# Patient Record
Sex: Female | Born: 1935 | Race: Black or African American | Hispanic: No | Marital: Married | Smoking: Never smoker
Health system: Southern US, Community
[De-identification: ages and names within clinical notes are randomized; demographics above are authoritative.]

## PROBLEM LIST (undated history)

## (undated) DIAGNOSIS — Z9889 Other specified postprocedural states: Secondary | ICD-10-CM

## (undated) DIAGNOSIS — E7800 Pure hypercholesterolemia, unspecified: Secondary | ICD-10-CM

## (undated) DIAGNOSIS — H9190 Unspecified hearing loss, unspecified ear: Secondary | ICD-10-CM

## (undated) DIAGNOSIS — E079 Disorder of thyroid, unspecified: Secondary | ICD-10-CM

## (undated) DIAGNOSIS — I422 Other hypertrophic cardiomyopathy: Secondary | ICD-10-CM

## (undated) DIAGNOSIS — I272 Pulmonary hypertension, unspecified: Secondary | ICD-10-CM

## (undated) DIAGNOSIS — Z7901 Long term (current) use of anticoagulants: Secondary | ICD-10-CM

## (undated) DIAGNOSIS — I4891 Unspecified atrial fibrillation: Secondary | ICD-10-CM

## (undated) DIAGNOSIS — I872 Venous insufficiency (chronic) (peripheral): Secondary | ICD-10-CM

## (undated) DIAGNOSIS — J984 Other disorders of lung: Secondary | ICD-10-CM

## (undated) DIAGNOSIS — K219 Gastro-esophageal reflux disease without esophagitis: Secondary | ICD-10-CM

## (undated) DIAGNOSIS — Z950 Presence of cardiac pacemaker: Secondary | ICD-10-CM

## (undated) DIAGNOSIS — I2510 Atherosclerotic heart disease of native coronary artery without angina pectoris: Secondary | ICD-10-CM

## (undated) DIAGNOSIS — I495 Sick sinus syndrome: Secondary | ICD-10-CM

## (undated) DIAGNOSIS — L80 Vitiligo: Secondary | ICD-10-CM

## (undated) DIAGNOSIS — IMO0001 Reserved for inherently not codable concepts without codable children: Secondary | ICD-10-CM

## (undated) HISTORY — PX: REVISION TOTAL HIP ARTHROPLASTY: SHX766

## (undated) HISTORY — DX: Venous insufficiency (chronic) (peripheral): I87.2

## (undated) HISTORY — DX: Long term (current) use of anticoagulants: Z79.01

## (undated) HISTORY — DX: Vitiligo: L80

## (undated) HISTORY — DX: Other specified postprocedural states: Z98.890

## (undated) HISTORY — PX: ABDOMINAL HYSTERECTOMY: SHX81

## (undated) HISTORY — PX: EYE SURGERY: SHX253

## (undated) HISTORY — DX: Other hypertrophic cardiomyopathy: I42.2

## (undated) HISTORY — DX: Presence of cardiac pacemaker: Z95.0

---

## 1996-10-30 DIAGNOSIS — I2510 Atherosclerotic heart disease of native coronary artery without angina pectoris: Secondary | ICD-10-CM

## 1996-10-30 HISTORY — DX: Atherosclerotic heart disease of native coronary artery without angina pectoris: I25.10

## 1996-10-30 HISTORY — PX: CORONARY ARTERY BYPASS GRAFT: SHX141

## 1999-03-16 ENCOUNTER — Ambulatory Visit (HOSPITAL_COMMUNITY): Admission: RE | Admit: 1999-03-16 | Discharge: 1999-03-16 | Payer: Self-pay | Admitting: Gastroenterology

## 1999-05-18 ENCOUNTER — Ambulatory Visit (HOSPITAL_COMMUNITY): Admission: RE | Admit: 1999-05-18 | Discharge: 1999-05-18 | Payer: Self-pay | Admitting: Gastroenterology

## 1999-05-18 ENCOUNTER — Encounter: Payer: Self-pay | Admitting: Gastroenterology

## 1999-07-13 ENCOUNTER — Encounter: Payer: Self-pay | Admitting: Gastroenterology

## 1999-07-13 ENCOUNTER — Ambulatory Visit (HOSPITAL_COMMUNITY): Admission: RE | Admit: 1999-07-13 | Discharge: 1999-07-13 | Payer: Self-pay | Admitting: Gastroenterology

## 2002-07-14 ENCOUNTER — Encounter: Payer: Self-pay | Admitting: Family Medicine

## 2002-07-14 ENCOUNTER — Encounter: Admission: RE | Admit: 2002-07-14 | Discharge: 2002-07-14 | Payer: Self-pay | Admitting: Family Medicine

## 2003-03-24 ENCOUNTER — Ambulatory Visit (HOSPITAL_COMMUNITY): Admission: RE | Admit: 2003-03-24 | Discharge: 2003-03-24 | Payer: Self-pay | Admitting: Gastroenterology

## 2003-03-24 ENCOUNTER — Encounter: Payer: Self-pay | Admitting: Gastroenterology

## 2003-04-21 ENCOUNTER — Encounter (INDEPENDENT_AMBULATORY_CARE_PROVIDER_SITE_OTHER): Payer: Self-pay | Admitting: *Deleted

## 2003-04-21 ENCOUNTER — Inpatient Hospital Stay (HOSPITAL_COMMUNITY): Admission: RE | Admit: 2003-04-21 | Discharge: 2003-04-28 | Payer: Self-pay

## 2003-04-24 ENCOUNTER — Encounter: Payer: Self-pay | Admitting: Surgery

## 2003-08-26 ENCOUNTER — Inpatient Hospital Stay (HOSPITAL_COMMUNITY): Admission: RE | Admit: 2003-08-26 | Discharge: 2003-08-28 | Payer: Self-pay

## 2003-10-18 ENCOUNTER — Emergency Department (HOSPITAL_COMMUNITY): Admission: EM | Admit: 2003-10-18 | Discharge: 2003-10-18 | Payer: Self-pay | Admitting: Emergency Medicine

## 2004-08-10 ENCOUNTER — Encounter: Admission: RE | Admit: 2004-08-10 | Discharge: 2004-09-01 | Payer: Self-pay | Admitting: Family Medicine

## 2004-09-09 ENCOUNTER — Encounter: Admission: RE | Admit: 2004-09-09 | Discharge: 2004-09-09 | Payer: Self-pay | Admitting: Cardiovascular Disease

## 2004-10-04 ENCOUNTER — Encounter: Admission: RE | Admit: 2004-10-04 | Discharge: 2004-10-04 | Payer: Self-pay | Admitting: Family Medicine

## 2004-10-14 ENCOUNTER — Encounter: Admission: RE | Admit: 2004-10-14 | Discharge: 2004-10-14 | Payer: Self-pay | Admitting: Family Medicine

## 2004-10-28 ENCOUNTER — Encounter: Admission: RE | Admit: 2004-10-28 | Discharge: 2004-10-28 | Payer: Self-pay | Admitting: Family Medicine

## 2005-04-12 ENCOUNTER — Inpatient Hospital Stay (HOSPITAL_COMMUNITY): Admission: EM | Admit: 2005-04-12 | Discharge: 2005-04-14 | Payer: Self-pay | Admitting: Emergency Medicine

## 2005-06-16 ENCOUNTER — Encounter: Admission: RE | Admit: 2005-06-16 | Discharge: 2005-06-16 | Payer: Self-pay

## 2005-12-22 ENCOUNTER — Encounter: Admission: RE | Admit: 2005-12-22 | Discharge: 2005-12-22 | Payer: Self-pay | Admitting: Family Medicine

## 2006-06-04 ENCOUNTER — Encounter: Admission: RE | Admit: 2006-06-04 | Discharge: 2006-06-04 | Payer: Self-pay | Admitting: Family Medicine

## 2006-06-22 ENCOUNTER — Encounter: Admission: RE | Admit: 2006-06-22 | Discharge: 2006-06-22 | Payer: Self-pay | Admitting: Family Medicine

## 2006-10-31 ENCOUNTER — Emergency Department (HOSPITAL_COMMUNITY): Admission: EM | Admit: 2006-10-31 | Discharge: 2006-10-31 | Payer: Self-pay | Admitting: Emergency Medicine

## 2006-12-31 ENCOUNTER — Emergency Department (HOSPITAL_COMMUNITY): Admission: EM | Admit: 2006-12-31 | Discharge: 2006-12-31 | Payer: Self-pay | Admitting: Emergency Medicine

## 2007-01-02 ENCOUNTER — Emergency Department (HOSPITAL_COMMUNITY): Admission: EM | Admit: 2007-01-02 | Discharge: 2007-01-02 | Payer: Self-pay | Admitting: Emergency Medicine

## 2007-01-29 ENCOUNTER — Inpatient Hospital Stay (HOSPITAL_COMMUNITY): Admission: RE | Admit: 2007-01-29 | Discharge: 2007-02-07 | Payer: Self-pay | Admitting: Orthopedic Surgery

## 2007-07-05 DIAGNOSIS — Z9289 Personal history of other medical treatment: Secondary | ICD-10-CM

## 2007-07-05 DIAGNOSIS — Z9889 Other specified postprocedural states: Secondary | ICD-10-CM

## 2007-07-05 HISTORY — DX: Other specified postprocedural states: Z98.890

## 2007-07-05 HISTORY — DX: Personal history of other medical treatment: Z92.89

## 2007-07-06 ENCOUNTER — Inpatient Hospital Stay (HOSPITAL_COMMUNITY): Admission: RE | Admit: 2007-07-06 | Discharge: 2007-07-11 | Payer: Self-pay | Admitting: Cardiovascular Disease

## 2007-10-31 HISTORY — PX: CARDIAC ELECTROPHYSIOLOGY STUDY AND ABLATION: SHX1294

## 2009-06-15 ENCOUNTER — Inpatient Hospital Stay (HOSPITAL_COMMUNITY): Admission: EM | Admit: 2009-06-15 | Discharge: 2009-06-20 | Payer: Self-pay | Admitting: Emergency Medicine

## 2009-07-30 DIAGNOSIS — I495 Sick sinus syndrome: Secondary | ICD-10-CM

## 2009-07-30 DIAGNOSIS — Z950 Presence of cardiac pacemaker: Secondary | ICD-10-CM

## 2009-07-30 HISTORY — DX: Presence of cardiac pacemaker: Z95.0

## 2009-07-30 HISTORY — PX: PACEMAKER INSERTION: SHX728

## 2009-07-30 HISTORY — DX: Sick sinus syndrome: I49.5

## 2009-08-13 ENCOUNTER — Inpatient Hospital Stay (HOSPITAL_COMMUNITY): Admission: EM | Admit: 2009-08-13 | Discharge: 2009-08-26 | Payer: Self-pay | Admitting: Emergency Medicine

## 2009-08-13 HISTORY — PX: CARDIAC CATHETERIZATION: SHX172

## 2009-08-18 ENCOUNTER — Encounter (INDEPENDENT_AMBULATORY_CARE_PROVIDER_SITE_OTHER): Payer: Self-pay | Admitting: Cardiology

## 2009-10-21 ENCOUNTER — Encounter: Admission: RE | Admit: 2009-10-21 | Discharge: 2009-10-21 | Payer: Self-pay | Admitting: Orthopedic Surgery

## 2009-10-30 HISTORY — PX: CATARACT EXTRACTION: SUR2

## 2010-06-09 ENCOUNTER — Ambulatory Visit (HOSPITAL_COMMUNITY): Admission: RE | Admit: 2010-06-09 | Discharge: 2010-06-09 | Payer: Self-pay | Admitting: Ophthalmology

## 2010-10-18 ENCOUNTER — Encounter
Admission: RE | Admit: 2010-10-18 | Discharge: 2010-10-18 | Payer: Self-pay | Source: Home / Self Care | Attending: Orthopedic Surgery | Admitting: Orthopedic Surgery

## 2010-11-19 ENCOUNTER — Encounter: Payer: Self-pay | Admitting: Family Medicine

## 2010-12-27 ENCOUNTER — Encounter (HOSPITAL_COMMUNITY)
Admission: RE | Admit: 2010-12-27 | Discharge: 2010-12-27 | Disposition: A | Discharge status: Home / Self Care | Payer: Medicare Other | Source: Ambulatory Visit

## 2010-12-27 ENCOUNTER — Ambulatory Visit (HOSPITAL_COMMUNITY)
Admission: RE | Admit: 2010-12-27 | Discharge: 2010-12-27 | Disposition: A | Discharge status: Home / Self Care | Payer: Medicare Other | Source: Ambulatory Visit | Attending: Orthopedic Surgery | Admitting: Orthopedic Surgery

## 2010-12-27 ENCOUNTER — Other Ambulatory Visit (HOSPITAL_COMMUNITY): Payer: Self-pay | Admitting: Orthopedic Surgery

## 2010-12-27 DIAGNOSIS — R0602 Shortness of breath: Secondary | ICD-10-CM | POA: Insufficient documentation

## 2010-12-27 DIAGNOSIS — M25551 Pain in right hip: Secondary | ICD-10-CM

## 2010-12-27 DIAGNOSIS — Z01818 Encounter for other preprocedural examination: Secondary | ICD-10-CM | POA: Insufficient documentation

## 2010-12-27 DIAGNOSIS — M25559 Pain in unspecified hip: Secondary | ICD-10-CM | POA: Insufficient documentation

## 2010-12-27 DIAGNOSIS — Z950 Presence of cardiac pacemaker: Secondary | ICD-10-CM | POA: Insufficient documentation

## 2010-12-27 DIAGNOSIS — I10 Essential (primary) hypertension: Secondary | ICD-10-CM | POA: Insufficient documentation

## 2011-01-03 ENCOUNTER — Encounter (HOSPITAL_COMMUNITY)
Admission: RE | Admit: 2011-01-03 | Discharge: 2011-01-03 | Disposition: A | Discharge status: Home / Self Care | Payer: Medicare Other | Source: Ambulatory Visit | Attending: Orthopedic Surgery | Admitting: Orthopedic Surgery

## 2011-01-03 DIAGNOSIS — Z01812 Encounter for preprocedural laboratory examination: Secondary | ICD-10-CM | POA: Insufficient documentation

## 2011-01-03 LAB — DIFFERENTIAL
Basophils Relative: 1 % (ref 0–1)
Eosinophils Absolute: 0.2 10*3/uL (ref 0.0–0.7)
Lymphs Abs: 1.6 10*3/uL (ref 0.7–4.0)
Monocytes Absolute: 0.5 10*3/uL (ref 0.1–1.0)
Monocytes Relative: 10 % (ref 3–12)

## 2011-01-03 LAB — BASIC METABOLIC PANEL
BUN: 14 mg/dL (ref 6–23)
Calcium: 9.4 mg/dL (ref 8.4–10.5)
Chloride: 102 mEq/L (ref 96–112)
Creatinine, Ser: 1.03 mg/dL (ref 0.4–1.2)
GFR calc Af Amer: >60 mL/min (ref >60)
GFR calc non Af Amer: 51 mL/min — ABNORMAL LOW (ref >60)

## 2011-01-03 LAB — CBC
MCH: 28.4 pg (ref 26.0–34.0)
MCHC: 33.3 g/dL (ref 30.0–36.0)
MCV: 85.5 fL (ref 78.0–100.0)
Platelets: 288 10*3/uL (ref 150–400)

## 2011-01-03 LAB — URINALYSIS, ROUTINE W REFLEX MICROSCOPIC
Glucose, UA: NEGATIVE mg/dL
Hgb urine dipstick: NEGATIVE
Protein, ur: NEGATIVE mg/dL
pH: 5.5 (ref 5.0–8.0)

## 2011-01-03 LAB — URINE MICROSCOPIC-ADD ON

## 2011-01-03 LAB — PROTIME-INR: Prothrombin Time: 37 seconds — ABNORMAL HIGH (ref 11.6–15.2)

## 2011-01-03 LAB — SURGICAL PCR SCREEN
MRSA, PCR: NEGATIVE
Staphylococcus aureus: NEGATIVE

## 2011-01-11 ENCOUNTER — Other Ambulatory Visit: Payer: Self-pay | Admitting: Orthopedic Surgery

## 2011-01-11 ENCOUNTER — Inpatient Hospital Stay (HOSPITAL_COMMUNITY)
Admission: RE | Admit: 2011-01-11 | Discharge: 2011-01-16 | DRG: 468 | Disposition: A | Discharge status: Skilled Nursing Facility | Payer: Medicare Other | Source: Ambulatory Visit | Attending: Orthopedic Surgery | Admitting: Orthopedic Surgery

## 2011-01-11 ENCOUNTER — Inpatient Hospital Stay (HOSPITAL_COMMUNITY): Payer: Medicare Other

## 2011-01-11 DIAGNOSIS — Z951 Presence of aortocoronary bypass graft: Secondary | ICD-10-CM

## 2011-01-11 DIAGNOSIS — M25559 Pain in unspecified hip: Principal | ICD-10-CM | POA: Diagnosis present

## 2011-01-11 DIAGNOSIS — Z950 Presence of cardiac pacemaker: Secondary | ICD-10-CM

## 2011-01-11 DIAGNOSIS — I2510 Atherosclerotic heart disease of native coronary artery without angina pectoris: Secondary | ICD-10-CM | POA: Diagnosis present

## 2011-01-11 LAB — APTT
aPTT: 28 seconds (ref 24–37)
aPTT: 29 seconds (ref 24–37)

## 2011-01-11 LAB — PROTIME-INR: Prothrombin Time: 15.7 seconds — ABNORMAL HIGH (ref 11.6–15.2)

## 2011-01-12 LAB — BASIC METABOLIC PANEL
BUN: 12 mg/dL (ref 6–23)
CO2: 34 mEq/L — ABNORMAL HIGH (ref 19–32)
Calcium: 8 mg/dL — ABNORMAL LOW (ref 8.4–10.5)
Chloride: 102 mEq/L (ref 96–112)
Creatinine, Ser: 0.88 mg/dL (ref 0.4–1.2)

## 2011-01-12 LAB — CBC
HCT: 30.8 % — ABNORMAL LOW (ref 36.0–46.0)
MCHC: 32.5 g/dL (ref 30.0–36.0)
Platelets: 214 10*3/uL (ref 150–400)
RDW: 15.2 % (ref 11.5–15.5)
WBC: 6.6 10*3/uL (ref 4.0–10.5)

## 2011-01-12 LAB — PROTIME-INR: INR: 1.24 (ref 0.00–1.49)

## 2011-01-13 ENCOUNTER — Inpatient Hospital Stay (HOSPITAL_COMMUNITY): Payer: Medicare Other

## 2011-01-13 DIAGNOSIS — R0902 Hypoxemia: Secondary | ICD-10-CM

## 2011-01-13 DIAGNOSIS — R0602 Shortness of breath: Secondary | ICD-10-CM

## 2011-01-13 LAB — PROTIME-INR: Prothrombin Time: 19 seconds — ABNORMAL HIGH (ref 11.6–15.2)

## 2011-01-13 LAB — CBC
HCT: 38.8 % (ref 36.0–46.0)
MCH: 28.4 pg (ref 26.0–34.0)
MCH: 27.8 pg (ref 26.0–34.0)
MCHC: 33.5 g/dL (ref 30.0–36.0)
MCHC: 32.7 g/dL (ref 30.0–36.0)
MCV: 84.9 fL (ref 78.0–100.0)
Platelets: 168 10*3/uL (ref 150–400)
RDW: 15 % (ref 11.5–15.5)
RDW: 16.5 % — ABNORMAL HIGH (ref 11.5–15.5)

## 2011-01-13 LAB — BASIC METABOLIC PANEL
BUN: 18 mg/dL (ref 6–23)
CO2: 30 mEq/L (ref 19–32)
Chloride: 102 mEq/L (ref 96–112)
GFR calc non Af Amer: 41 mL/min — ABNORMAL LOW (ref >60)
Glucose, Bld: 102 mg/dL — ABNORMAL HIGH (ref 70–99)
Potassium: 4 mEq/L (ref 3.5–5.1)

## 2011-01-13 LAB — CARDIAC PANEL(CRET KIN+CKTOT+MB+TROPI)
CK, MB: 1.4 ng/mL (ref 0.3–4.0)
Relative Index: 0.8 (ref 0.0–2.5)
Total CK: 166 U/L (ref 7–177)
Troponin I: 0.01 ng/mL (ref 0.00–0.06)

## 2011-01-14 DIAGNOSIS — J9819 Other pulmonary collapse: Secondary | ICD-10-CM

## 2011-01-14 DIAGNOSIS — R0602 Shortness of breath: Secondary | ICD-10-CM

## 2011-01-14 DIAGNOSIS — D649 Anemia, unspecified: Secondary | ICD-10-CM

## 2011-01-14 LAB — CARDIAC PANEL(CRET KIN+CKTOT+MB+TROPI)
Relative Index: 0.9 (ref 0.0–2.5)
Total CK: 133 U/L (ref 7–177)
Troponin I: 0.01 ng/mL (ref 0.00–0.06)

## 2011-01-14 LAB — PROTIME-INR
INR: 1.52 — ABNORMAL HIGH (ref 0.00–1.49)
Prothrombin Time: 18.5 seconds — ABNORMAL HIGH (ref 11.6–15.2)

## 2011-01-14 LAB — CBC
MCH: 28 pg (ref 26.0–34.0)
MCHC: 32.5 g/dL (ref 30.0–36.0)
RDW: 15 % (ref 11.5–15.5)

## 2011-01-14 NOTE — Op Note (Signed)
NAME:  Gina [name], Gina [name] NO.:  [id number]  MEDICAL RECORD NO.:  [id number]           PATIENT TYPE:  I  LOCATION:  5032                         FACILITY:  [facility]  PHYSICIAN:  Feliberto Gottron. Turner Daniels, M.D.   DATE OF BIRTH:  1925/05/06  DATE OF PROCEDURE:  01/11/2011 DATE OF DISCHARGE:                              OPERATIVE REPORT   PREOPERATIVE DIAGNOSIS:  Painful right ASR on S-ROM total hip.  POSTOPERATIVE DIAGNOSIS:  Painful right ASR on S-ROM total hip.  PROCEDURE:  Removal of painful ASR shell femoral head, femoral module, implantation of a 60-mm Gription sector cup with a central occluder, two long dome screws, 10-degree polyethylene liner indexed anterior superior, +0 36-mm ceramic head.  SURGEON:  Feliberto Gottron. Turner Daniels, MD  FIRST ASSISTANT:  Shirl Harris, PA  ANESTHETIC:  General endotracheal.  ESTIMATED BLOOD LOSS:  400 mL.  FLUID REPLACEMENT:  Crystalloid 1800 mL.  DRAINS PLACED:  Foley catheter.  URINE OUTPUT:  300 mL.  INDICATIONS FOR PROCEDURE:  The patient is an 77 year old woman who underwent a primary ASR and S-ROM total hip in 2008.  Over the last year or so, she developed progressive pain and increasing metal ion levels. I believe the cobalt level was in the 17-18 range and the acromion level was in the range of 23 or 24, but most importantly she was having increasing pain.  Plain radiographs did not show any overt evidence of loosening nor did MRI scanning, but her pain has progressed to the point where she is feeling like she may fall down and again she does have the elevated ion levels.  In order to decrease pain and increase function, she desires elective revision of her total hip with removal of the ASR implants, revision to a titanium shell polyethylene liner ceramic head, which of course have no cobalt or chromium in them.  The risks and benefits of surgery were discussed at length, questions answered. Although she has no evidence of  infection, we will take intraoperative cultures to make sure.  DESCRIPTION OF PROCEDURE:  The patient was identified by armband and received preoperative IV antibiotics in the holding area at [facility].  She was then taken to operating room 10.  Appropriate anesthetic monitors were attached and general endotracheal anesthesia induced with the patient in supine position.  Foley catheter inserted, rolled into the left lateral decubitus position, and fixed there with a Stulberg Mark II pelvic clamp.  The right lower extremity prepped and draped in usual sterile fashion from the ankle to the hemipelvis and a time-out procedure performed.  Skin along the lateral hip and thigh infiltrated with 10 mL of 0.5% Marcaine and epinephrine solution and a 15-cm incision was recreated using the old posterolateral incision through the skin and subcutaneous tissue down to the level of the IT band which was cut in line with the skin position.  This exposed the greater trochanter and the lateral femoral shaft.  We then set about removing pseudocapsule from the intertrochanteric crest peeling it back down to the level of the implant and serosanguineous fluid was expressed from the joint and sent off for  Gram stain and culture and had a translucent brown appearance.  The capsular flap was peeled back posterior-superior off the acetabulum, posterior-inferior exposing the posterior edge of the ASR shell.  Once we had completed this portion exposed, the hip was flexed and externally rotated dislocating the ASR head which was then removed with a carpenter's drift from the trunnion which was in good condition.  We then dissected between the superior- anterior bone on the muscle allowing [place] to tuck the trunnion superior- anterior to the acetabulum.  It was then translated forward with a Hohmann retractor levering off the anterior column.  This gave [place] excellent exposure of the ASR shell which was not  loose.  We then set about removing the entire shell by taking a quarter-inch osteotome and just breaking the seal between the bone and the AST cup going from anterior to superior to posterior-inferior.  We then inserted the short 54-mm Innomed curved osteotome and set about breaking the bond between the ASR shell and the acetabulum.  Having gone circumferentially with a short curved osteotome, shell was still not loose and we loaded the long 54-mm curved osteotome, and after going around 270 degrees around the shell it easily came out.  At this point, it was removed.  The underlying bone quality was relatively good.  The previous total hip that reamed medially to the medial wall teardrop.  We had good anterior, superior, and posterior bone.  We then selected a 57-mm basket reamer and lightly reamed and then reamed up to a 59-mm basket reamer obtaining good coverage in all quadrants.  Trial 58, trial 60 acetabular shells were then inserted, 60 hung nicely on the rim.  We then selected a 60-mm DePuy Gription cup with superior sector dome screws and hammered it into place in about 45 degrees of abduction and 30 degrees of anteversion following the rim of the acetabulum as it was on the table.  Although the shell went in nicely and had good fixation because this was a revision, we then inserted two dome screws up into the column of the pelvis obtaining good firm fixation.  Because of the anteversion of the shell, we went ahead and inserted a trial 10-degree liner with index anterior, slightly superior, and then performed trial reductions with +0 and +3 36-mm head.  The +3 had the best range of motion and stability. It could be flexed to 90 degrees with 70 of internal rotation.  It could not be dislocated in extension and external rotation.  At this point, the trial liner was removed with central occluder inserted followed by a 10-degree polyethylene liner indexed anterior-superior which  snapped into place nicely.  We then hammered into place a 36-mm ceramic head +3, reduced the hip, checked our stability, and found it to be excellent. At this point, the wound was thoroughly irrigated out with normal saline solution.  The capsular flap was repaired back to the intertrochanteric crest through drill holes with #2 Ethibond suture.  The IT band closed with running #1 Vicryl suture and the subcutaneous tissue closed with 0 and 2-0 undyed Vicryl suture.  Skin was closed with running interlocking 3-0 nylon suture and a dressing of Xeroform and Mepilex was then applied.  The patient was unclamped, rolled supine, awakened, and taken to the recovery room without difficulty.     Feliberto Gottron. Turner Daniels, M.D.     Ovid Curd  D:  01/11/2011  T:  01/12/2011  Job:  [id number]  Electronically Signed by Gean Birchwood  M.D. on 01/14/2011 09:30:24 AM

## 2011-01-15 LAB — TYPE AND SCREEN
DAT, IgG: NEGATIVE
Donor AG Type: NEGATIVE
Donor AG Type: NEGATIVE
PT AG Type: NEGATIVE
Unit division: 0
Unit division: 0

## 2011-01-15 LAB — PROTIME-INR
INR: 1.44 (ref 0.00–1.49)
Prothrombin Time: 17.7 seconds — ABNORMAL HIGH (ref 11.6–15.2)

## 2011-01-15 LAB — BODY FLUID CULTURE
Culture: NO GROWTH
Gram Stain: NONE SEEN

## 2011-01-16 LAB — ANAEROBIC CULTURE: Gram Stain: NONE SEEN

## 2011-01-16 LAB — PROTIME-INR: INR: 1.64 — ABNORMAL HIGH (ref 0.00–1.49)

## 2011-01-18 NOTE — Op Note (Signed)
  NAME:  Gina [name], Gina [name] NO.:  [id number]  MEDICAL RECORD NO.:  [id number]          PATIENT TYPE:  AMB  LOCATION:  SDS                          FACILITY:  [facility]  PHYSICIAN:  Chalmers Guest, M.D.     DATE OF BIRTH:  [birth year]  DATE OF PROCEDURE:  06/09/2010 DATE OF DISCHARGE:  06/09/2010                              OPERATIVE REPORT  PREOPERATIVE DIAGNOSIS:  Visually significant cataract, right eye.  POSTOPERATIVE DIAGNOSIS:  Visually significant cataract, right eye.  PROCEDURE:  Phacoemulsification with intraocular lens implant.  COMPLICATIONS:  None.  ANESTHESIA:  Consisted of 2% Xylocaine with epinephrine and 50:50 mixture of 0.75% Marcaine with an ampule of Wydase.  PROCEDURE IN DETAIL:  The patient was given a peribulbar block with aforementioned local anesthetic agent and under monitored anesthesia, pressure was applied to the eye.  Following this, the patient's face was prepped and draped in usual sterile fashion.  Lid speculum was inserted with the surgeon sitting temporally.  It was noted to the patient tended to move her head excessively, therefore, we had to keep my hand or the assistant's hand on the patient's head at all times.  Following this with the microscope in position, a 15-degree blade was used to enter through superior clear cornea at the 11 o'clock position and Viscoat was injected.  A Weck-Cel was used to fixate the globe and a 2.75-mm keratome blade was used in a stepwise fashion through temporal clear cornea to enter the eye.  Additional Viscoat was injected.  The bent 25- gauge needle was used to incise the anterior capsule, and a curvilinear continuous tear capsulorrhexis was formed.  Utrata forceps were used to remove the anterior capsule.  BSS was used to hydrodissect and hydrodelineate the nucleus.  Following this, the phacoemulsification chamber was then used to sculpt the nucleus, and the nucleus was then cracked into three  quadrants, and an attempt was made to aspirate the nucleus, the patient continued to move her head, therefore, deeper sculpting took place and then the nuclear fragments were removed, and the posterior capsule remained intact.  IA was used to strip the cortical fibers from the posterior capsule.  We removed the subincisional cortex.  Following this, Provisc was injected in the eye and the intraocular lens implant was noted to have no defect.  It was an Alcon AcrySof lens, A-Constant 118.7.  The lens is a [id number] diopter lens was placed in the lens.  It was injected, unfolded in the capsular bag.  It was positioned with a Kuglen hook, guide was used to remove subincisional cortex and viscoelastic.  The chamber was deepened with BSS.  Miochol was injected.  The cornea was hydrated.  There was no leakage, therefore, the lid speculum was removed.  Topical TobraDex was applied to the eye.  Patch and Fox shield were placed and the patient returned to recovery area in stable condition.     Chalmers Guest, M.D.    RW/MEDQ  D:  06/09/2010  T:  06/10/2010  Job:  [id number]  Electronically Signed by Chalmers Guest M.D. on 01/18/2011 04:38:17 PM

## 2011-01-19 NOTE — Discharge Summary (Signed)
NAME:  Gina [name], Gina [name] NO.:  [id number]  MEDICAL RECORD NO.:  [id number]           PATIENT TYPE:  I  LOCATION:  5032                         FACILITY:  [facility]  PHYSICIAN:  Feliberto Gottron. Turner Daniels, M.D.   DATE OF BIRTH:  [birth year]  DATE OF ADMISSION:  01/11/2011 DATE OF DISCHARGE:  01/16/2011                              DISCHARGE SUMMARY   CHIEF COMPLAINT:  Right hip pain.  HISTORY OF PRESENT ILLNESS:  This is an 77 year old lady who underwent primary total hip arthroplasty in 2008.  She developed progressive worsening pain with increased metal ion levels over the last year. There is no evidence of lucening on plain x-rays or MRI scan, but her pain and feeling of instability has now forced into the point that she is interested in a surgical intervention.  All risks and benefits of surgery were discussed with the patient.  PAST MEDICAL HISTORY:  Significant for atrial fibrillation, hypothyroidism, gout, hyperlipidemia, and chronic dyspnea.  PAST SURGICAL HISTORY:  Significant for right total hip arthroplasty, coronary artery bypass graft, and pacemaker placement.  ALLERGIES:  She has no known drug allergies.  SOCIAL HISTORY:  She denies use of alcohol or tobacco.  FAMILY HISTORY:  Noncontributory.  PHYSICAL EXAMINATION:  Gross examination of the right hip demonstrates range of motion to be 0 to 30 degrees of internal and external rotation with pain.  She has a negative foot tap.  Neurovascular exam is within normal limits.  X-rays and MRI scan showed no evidence of lucening, but there was some lucency around the acetabular cup seen on CT scan.  PREOP LABS:  White blood cells 4.9, red blood cells 4.82, hemoglobin 13.7, hematocrit 41.2, and platelets 288.  Sodium 143, potassium 4.2, chloride 102, glucose 102, BUN 14, creatinine 1.03.  Urinalysis was within normal limits.  PT 16.3, INR 1.29, PTT 28.  HOSPITAL COURSE:  Gina [name] was admitted to [facility] on  January 11, 2011, when she underwent revision of the right total hip arthroplasty. The procedure was performed by Dr. Gean Birchwood and the patient tolerated it well.  Synovial fluid was sent down to Pathology, no evidence of infection was seen.  A perioperative Foley catheter was placed and Ms. Gamero was transferred to the floor on Lovenox and Coumadin for DVT prophylaxis.  On the first postoperative day, she had well controlled pain but was satting on room air, she complained of exertional dyspnea with physical therapy and Medicine was consulted to manage her medical problems.  They followed her throughout the hospital stay.  On the second postoperative day, her hemoglobin was 9, surgical dressing remained dry. On postoperative daily 3, she continued slowly progress with therapy.  Her surgical dressing was changed.  Her incision was found to be benign.  She remained medically stable through the weekend and was discharged on Monday the fifth postoperative day to a skilled nursing facility.  DISPOSITION:  The patient was discharged to skilled nursing on January 16, 2011.  She was 50% weightbearing on the operative leg and would remain on her home Coumadin and aspirin for DVT prophylaxis.  Skilled nursing will manage the  wound and therapy.  She will return in followup on postoperative day 14 for x-rays and suture removal.  FINAL DIAGNOSIS:  Painful right ASR hip.     Shirl Harris, PA   ______________________________ Feliberto Gottron. Turner Daniels, M.D.    JW/MEDQ  D:  01/16/2011  T:  01/16/2011  Job:  [id number]  Electronically Signed by Shirl Harris PA on 01/17/2011 04:18:29 PM Electronically Signed by Gean Birchwood M.D. on 01/19/2011 07:40:40 AM

## 2011-01-22 NOTE — Consult Note (Signed)
NAME:  Gina [name], Gina [name] NO.:  [id number]  MEDICAL RECORD NO.:  [id number]           PATIENT TYPE:  I  LOCATION:  5032                         FACILITY:  [facility]  PHYSICIAN:  Kalman Shan, MD   DATE OF BIRTH:  1925/08/19  DATE OF CONSULTATION:  01/13/2011 DATE OF DISCHARGE:                                CONSULTATION   CONSULTING PHYSICIAN:  Feliberto Gottron. Turner Daniels, MD, with [facility].  REASON FOR CONSULTATION:  Hypoxia.  HISTORY OF PRESENT ILLNESS:  This is a very pleasant 77 year old female patient status post a right total hip redo surgery on January 11, 2011, secondary to increased pain, and increased metal ion levels detected on serum evaluation.  She underwent the procedure on the 14th, it was noted to be uneventful.  She has postoperatively been undergoing routine postoperative physical therapy and occupational therapy with nursing and rehabilitation staff noting increased exertional dyspnea.  Postoperative day #1, she was noted to have resting hypoxia with saturations 80%, this was on room air.  Additionally on January 13, 2011, she was noted to have exertional dyspnea with saturations down to 85 on room air with exertion.  Because of these findings, the Pulmonary Service was asked to evaluate.  PAST MEDICAL HISTORY:  Atrial flutter, atrial fibrillation, she underwent an ablation in 2008 at [facility].  She also had a recurrent history of atrial flutter and atrial fibrillation in 2010.  This was treated by Dr. Lynnea Ferrier with cardioversion, and since that time she has been maintained in normal sinus rhythm on amiodarone and Coumadin.  She also has a history of sick sinus syndrome, as well as symptomatic bradycardia.  For this, she had a [employer]. Jude pacemaker placed in 2010.  She has a history of coronary artery disease.  She has had a CABG in 1998.  Her last known ejection fraction was recorded at 50-55% during her last  cardioversion in 2010.  She had a reported history of pulmonary artery hypertension in 2009, currently do not have specific numbers, however, I do note that this is from echocardiogram.  She also carries a history of hypertrophic obstructive cardiomyopathy, hypothyroidism, gout, hyperlipidemia, and chronic dyspnea.  SOCIAL HISTORY:  [place] lives here in [place] with family.  She is a prior smoker.  She is retired.  FAMILY HISTORY:  Positive for coronary artery disease.  ALLERGIES:  No known drug allergies.  CURRENT MEDICATIONS: 1. Amiodarone 200 mg daily. 2. Aspirin 81 daily. 3. Vitamin D 3000 units daily. 4. Colace 100 mg b.i.d. 5. Ferrous sulfate 325 daily. 6. Lasix 40 daily. 7. Levothyroxine 100 mcg daily. 8. Multivitamin daily. 9. Crestor 10 mg daily. 10.Senokot 1 tablet daily. 11.Warfarin 2.5 mg a day endorsed  by pharmacy. 12.Tylenol p.r.n. 13.Bisacodyl p.r.n. 14.Dilaudid p.r.n. 15.Percocet p.r.n. 16.Ambien p.r.n.  REVIEW OF SYSTEMS:  GENERAL:  This is a pleasant 77 year old female who currently denies fever, malaise, generalized discomfort.  He does endorse chronic fatigue which has been present for at least 2 months. HEENT:  Denies headache, visual change, nasal congestion, sore throat, dysphagia, or phonation change.  PULMONARY:  Endorses dyspnea dating back now to  approximately 8 weeks.  She says this is associated primarily with exertion.  She reports that bending over and tying her shoes at baseline would make her short of breath, with being like this for the last 2 months minimally, she also reports slowly progressive increased exertional dyspnea, and progressive decreased activity tolerance.  She denies cough, denies wheezing, denies chest pain both with exertion or pleuritic pain in nature.  She does endorse an occasional sensation of cardiac palpitations.  She is noted these to be transient, however, has been recurrent over the last 2 months, and  she feels may be in correlation with her progressive dyspnea.  Additional cardiac findings remarkable.  EXTREMITIES:  She has typical right hip discomfort following hip surgery.  She does endorse mild lower extremity swelling.  GI:  Normal within typical limits.  NEUROLOGICALLY:  Within typical limits.  ENDO:  No hot or cold intolerance.  PHYSICAL EXAMINATION:  VITAL SIGNS:  Temperature 99, heart rate 73, blood pressure 108/60, respirations 18-20, and saturation 98% on 2 L. Please note she desaturate to 85 with exertion on room air. GENERAL:  This is a pleasant 77 year old African American female currently in no acute distress, however, does endorse recovering from mild dyspnea following ambulating approximately 15 feet in the room with physical therapy and occupational therapy. HEENT:  She does have jugular venous distention at approximately 30 degrees in the bed.  Her mucous membranes are moist.  She has no palpable adenopathy. PULMONARY:  Breath sounds are equal, nonlabored, she has decreased in both bases posteriorly. CARDIAC:  Currently regular rate and rhythm with 3/6 systolic murmur, also systolic murmur, this is over the left sternal border. ABDOMEN: Soft and nontender.  There is no appreciable organomegaly. GU:  Voids. NEUROLOGIC:  Grossly intact. EXTREMITIES:  Notable for lower extremity dependent edema.  CURRENT LABORATORY DATA:  White blood cell count 5.9, hemoglobin 9, hematocrit 26.9, platelet count 160, sodium 140, potassium 4.2, chloride 102, CO2 of 34, BUN 12, creatinine 0.8, glucose 120.  There is no current chest x-ray to evaluate.  IMPRESSION AND PLAN:  Postoperative hypoxia with ambulatory saturations in the mid 80s on room air.  This sounds as though this is an acute-on- chronic problem which has been slowly progressive in nature over the last 2 months.  She has a strong cardiac history with a known history of coronary artery disease, atrial fibrillation, and  known cardiomyopathy, so certainly postoperative myocardial strain with concern for demand ischemia would be high on list of differential diagnosis, especially with concern for resultant pulmonary edema.  Also high likelihood of postoperative atelectasis given decreased mobility, also would consider pulmonary emboli, also on the list of differential diagnosis is pulmonary emboli, she has been on long-term anticoagulation for atrial fibrillation, also was anticoagulated postoperatively, for this seems lower down on the list of differential diagnosis, however, not implausible.  I doubt that this is a pneumonia, there is no clinical evidence to support such diagnosis.  Plan at this point will be to go ahead and obtain a chest x-ray to evaluate for atelectasis, or pulmonary edema.  Obtain 12-lead EKG, BNP, cycle cardiac enzymes.  I think it would be reasonable to repeat echocardiogram given slow progressive report of decreased activity tolerance.  We will go ahead and add incentive spirometry, flutter valve, consider diuresis depending on results of BNP.  We will consider diuresis depending on results a BNP and chest x-ray.  Additionally depending on results of echocardiogram and cardiac enzymes and BNP might  consider involving Cardiology given she has had a relationship with [facility].  At this point, I think you can forego antibiotics.  There is no evidence of an acute infection, would be careful with narcotics, we will go ahead and discontinue the Dilaudid. Additionally if the rest of the evaluation is negative, her echocardiogram back in 2010 did demonstrate a concern for patent foramen ovale, therefore if no other further explanation for her exertional hypoxia, this may need to be reevaluated to determine degree of left-to- right shunt.  Thank you for the opportunity to see Gina [name], follow her with you.     Zenia Resides,  NP   ______________________________ Kalman Shan, MD    PB/MEDQ  D:  01/13/2011  T:  01/14/2011  Job:  [id number]  Electronically Signed by Zenia Resides NP on 01/16/2011 10:12:37 PM Electronically Signed by Kalman Shan MD on 01/22/2011 08:58:06 PM

## 2011-02-02 LAB — CBC
HCT: 37.2 % (ref 36.0–46.0)
HCT: 37.2 % (ref 36.0–46.0)
HCT: 37.2 % (ref 36.0–46.0)
Hemoglobin: 11.5 g/dL — ABNORMAL LOW (ref 12.0–15.0)
Hemoglobin: 11.7 g/dL — ABNORMAL LOW (ref 12.0–15.0)
Hemoglobin: 12.4 g/dL (ref 12.0–15.0)
Hemoglobin: 12.7 g/dL (ref 12.0–15.0)
Hemoglobin: 12.5 g/dL (ref 12.0–15.0)
MCHC: 33 g/dL (ref 30.0–36.0)
MCHC: 33.1 g/dL (ref 30.0–36.0)
MCHC: 33.1 g/dL (ref 30.0–36.0)
MCHC: 33.2 g/dL (ref 30.0–36.0)
MCHC: 33.2 g/dL (ref 30.0–36.0)
MCHC: 33.3 g/dL (ref 30.0–36.0)
MCHC: 33.3 g/dL (ref 30.0–36.0)
MCHC: 33.3 g/dL (ref 30.0–36.0)
MCHC: 33.3 g/dL (ref 30.0–36.0)
MCHC: 33.3 g/dL (ref 30.0–36.0)
MCV: 85.2 fL (ref 78.0–100.0)
MCV: 85.3 fL (ref 78.0–100.0)
MCV: 85.4 fL (ref 78.0–100.0)
MCV: 85.4 fL (ref 78.0–100.0)
MCV: 85.4 fL (ref 78.0–100.0)
MCV: 85.7 fL (ref 78.0–100.0)
MCV: 85.7 fL (ref 78.0–100.0)
Platelets: 219 10*3/uL (ref 150–400)
Platelets: 224 10*3/uL (ref 150–400)
Platelets: 225 10*3/uL (ref 150–400)
Platelets: 228 10*3/uL (ref 150–400)
Platelets: 229 10*3/uL (ref 150–400)
Platelets: 231 10*3/uL (ref 150–400)
Platelets: 231 10*3/uL (ref 150–400)
Platelets: 248 10*3/uL (ref 150–400)
RBC: 4.09 MIL/uL (ref 3.87–5.11)
RBC: 4.38 MIL/uL (ref 3.87–5.11)
RBC: 4.53 MIL/uL (ref 3.87–5.11)
RBC: 4.65 MIL/uL (ref 3.87–5.11)
RDW: 15.8 % — ABNORMAL HIGH (ref 11.5–15.5)
RDW: 16 % — ABNORMAL HIGH (ref 11.5–15.5)
RDW: 16 % — ABNORMAL HIGH (ref 11.5–15.5)
RDW: 16.1 % — ABNORMAL HIGH (ref 11.5–15.5)
RDW: 16.2 % — ABNORMAL HIGH (ref 11.5–15.5)
RDW: 16.4 % — ABNORMAL HIGH (ref 11.5–15.5)
WBC: 4.7 10*3/uL (ref 4.0–10.5)
WBC: 4.8 10*3/uL (ref 4.0–10.5)
WBC: 5.1 10*3/uL (ref 4.0–10.5)
WBC: 5.3 10*3/uL (ref 4.0–10.5)
WBC: 5.7 10*3/uL (ref 4.0–10.5)
WBC: 5 10*3/uL (ref 4.0–10.5)

## 2011-02-02 LAB — COMPREHENSIVE METABOLIC PANEL
AST: 20 U/L (ref 0–37)
Albumin: 3.2 g/dL — ABNORMAL LOW (ref 3.5–5.2)
BUN: 11 mg/dL (ref 6–23)
Calcium: 8.6 mg/dL (ref 8.4–10.5)
Creatinine, Ser: 0.94 mg/dL (ref 0.4–1.2)
GFR calc Af Amer: >60 mL/min (ref >60)
Total Bilirubin: 0.9 mg/dL (ref 0.3–1.2)
Total Protein: 6.7 g/dL (ref 6.0–8.3)

## 2011-02-02 LAB — BASIC METABOLIC PANEL
BUN: 15 mg/dL (ref 6–23)
BUN: 23 mg/dL (ref 6–23)
BUN: 7 mg/dL (ref 6–23)
CO2: 30 mEq/L (ref 19–32)
CO2: 31 mEq/L (ref 19–32)
CO2: 31 mEq/L (ref 19–32)
CO2: 34 mEq/L — ABNORMAL HIGH (ref 19–32)
Calcium: 8.4 mg/dL (ref 8.4–10.5)
Calcium: 8.8 mg/dL (ref 8.4–10.5)
Calcium: 9.1 mg/dL (ref 8.4–10.5)
Calcium: 9.3 mg/dL (ref 8.4–10.5)
Chloride: 92 mEq/L — ABNORMAL LOW (ref 96–112)
Chloride: 98 mEq/L (ref 96–112)
Chloride: 98 mEq/L (ref 96–112)
Creatinine, Ser: 0.96 mg/dL (ref 0.4–1.2)
Creatinine, Ser: 1.02 mg/dL (ref 0.4–1.2)
Creatinine, Ser: 1.03 mg/dL (ref 0.4–1.2)
Creatinine, Ser: 1.13 mg/dL (ref 0.4–1.2)
Creatinine, Ser: 1.13 mg/dL (ref 0.4–1.2)
GFR calc Af Amer: 56 mL/min — ABNORMAL LOW (ref >60)
GFR calc Af Amer: 56 mL/min — ABNORMAL LOW (ref >60)
GFR calc Af Amer: >60 mL/min (ref >60)
GFR calc Af Amer: >60 mL/min (ref >60)
GFR calc Af Amer: >60 mL/min (ref >60)
GFR calc non Af Amer: 46 mL/min — ABNORMAL LOW (ref >60)
GFR calc non Af Amer: 56 mL/min — ABNORMAL LOW (ref >60)
Glucose, Bld: 106 mg/dL — ABNORMAL HIGH (ref 70–99)
Glucose, Bld: 110 mg/dL — ABNORMAL HIGH (ref 70–99)
Glucose, Bld: 117 mg/dL — ABNORMAL HIGH (ref 70–99)
Glucose, Bld: 92 mg/dL (ref 70–99)
Potassium: 3.6 mEq/L (ref 3.5–5.1)
Sodium: 135 mEq/L (ref 135–145)
Sodium: 138 mEq/L (ref 135–145)

## 2011-02-02 LAB — MAGNESIUM
Magnesium: 1.5 mg/dL (ref 1.5–2.5)
Magnesium: 1.8 mg/dL (ref 1.5–2.5)

## 2011-02-02 LAB — PROTIME-INR
INR: 1.21 (ref 0.00–1.49)
INR: 1.22 (ref 0.00–1.49)
INR: 1.49 (ref 0.00–1.49)
Prothrombin Time: 13.3 seconds (ref 11.6–15.2)
Prothrombin Time: 14.5 seconds (ref 11.6–15.2)
Prothrombin Time: 15 seconds (ref 11.6–15.2)
Prothrombin Time: 16.4 seconds — ABNORMAL HIGH (ref 11.6–15.2)
Prothrombin Time: 17.9 seconds — ABNORMAL HIGH (ref 11.6–15.2)

## 2011-02-02 LAB — HEPARIN LEVEL (UNFRACTIONATED)
Heparin Unfractionated: 0.38 IU/mL (ref 0.30–0.70)
Heparin Unfractionated: 0.42 IU/mL (ref 0.30–0.70)
Heparin Unfractionated: 0.47 IU/mL (ref 0.30–0.70)
Heparin Unfractionated: 0.55 IU/mL (ref 0.30–0.70)
Heparin Unfractionated: 0.57 IU/mL (ref 0.30–0.70)
Heparin Unfractionated: 0.59 IU/mL (ref 0.30–0.70)
Heparin Unfractionated: 0.69 IU/mL (ref 0.30–0.70)
Heparin Unfractionated: 0.89 IU/mL — ABNORMAL HIGH (ref 0.30–0.70)
Heparin Unfractionated: 0.99 IU/mL — ABNORMAL HIGH (ref 0.30–0.70)
Heparin Unfractionated: <0.1 IU/mL — ABNORMAL LOW (ref 0.30–0.70)
Heparin Unfractionated: >2 IU/mL — ABNORMAL HIGH (ref 0.30–0.70)

## 2011-02-02 LAB — URINE MICROSCOPIC-ADD ON

## 2011-02-02 LAB — POCT CARDIAC MARKERS
CKMB, poc: 1.7 ng/mL (ref 1.0–8.0)
Myoglobin, poc: 76.5 ng/mL (ref 12–200)
Troponin i, poc: <0.05 ng/mL (ref 0.00–0.09)

## 2011-02-02 LAB — URINALYSIS, ROUTINE W REFLEX MICROSCOPIC
Bilirubin Urine: NEGATIVE
Glucose, UA: NEGATIVE mg/dL
Ketones, ur: NEGATIVE mg/dL
Protein, ur: NEGATIVE mg/dL
pH: 6.5 (ref 5.0–8.0)

## 2011-02-02 LAB — BRAIN NATRIURETIC PEPTIDE: Pro B Natriuretic peptide (BNP): 468 pg/mL — ABNORMAL HIGH (ref 0.0–100.0)

## 2011-02-02 LAB — POCT I-STAT, CHEM 8
BUN: 19 mg/dL (ref 6–23)
Chloride: 103 mEq/L (ref 96–112)
Creatinine, Ser: 1.3 mg/dL — ABNORMAL HIGH (ref 0.4–1.2)
Potassium: 3.5 mEq/L (ref 3.5–5.1)
Sodium: 141 mEq/L (ref 135–145)

## 2011-02-02 LAB — D-DIMER, QUANTITATIVE: D-Dimer, Quant: 0.34 ug/mL-FEU (ref 0.00–0.48)

## 2011-02-02 LAB — CARDIAC PANEL(CRET KIN+CKTOT+MB+TROPI)
CK, MB: 3 ng/mL (ref 0.3–4.0)
Relative Index: INVALID (ref 0.0–2.5)
Relative Index: INVALID (ref 0.0–2.5)
Total CK: 58 U/L (ref 7–177)
Troponin I: 0.02 ng/mL (ref 0.00–0.06)
Troponin I: 0.02 ng/mL (ref 0.00–0.06)
Troponin I: 0.02 ng/mL (ref 0.00–0.06)

## 2011-02-02 LAB — LIPID PANEL
HDL: 55 mg/dL (ref >39)
LDL Cholesterol: 116 mg/dL — ABNORMAL HIGH (ref 0–99)
Total CHOL/HDL Ratio: 3.5 RATIO
Triglycerides: 94 mg/dL (ref <150)
VLDL: 19 mg/dL (ref 0–40)

## 2011-02-02 LAB — TSH: TSH: 1.967 u[IU]/mL (ref 0.350–4.500)

## 2011-02-04 LAB — BASIC METABOLIC PANEL WITH GFR
BUN: 12 mg/dL (ref 6–23)
BUN: 9 mg/dL (ref 6–23)
CO2: 30 meq/L (ref 19–32)
CO2: 33 meq/L — ABNORMAL HIGH (ref 19–32)
Calcium: 8.5 mg/dL (ref 8.4–10.5)
Calcium: 8.6 mg/dL (ref 8.4–10.5)
Chloride: 97 meq/L (ref 96–112)
Chloride: 99 meq/L (ref 96–112)
Creatinine, Ser: 0.84 mg/dL (ref 0.4–1.2)
Creatinine, Ser: 0.91 mg/dL (ref 0.4–1.2)
GFR calc non Af Amer: 59 mL/min — ABNORMAL LOW
GFR calc non Af Amer: >60 mL/min
Glucose, Bld: 101 mg/dL — ABNORMAL HIGH (ref 70–99)
Glucose, Bld: 96 mg/dL (ref 70–99)
Potassium: 4.2 meq/L (ref 3.5–5.1)
Potassium: 4.3 meq/L (ref 3.5–5.1)
Sodium: 137 meq/L (ref 135–145)
Sodium: 139 meq/L (ref 135–145)

## 2011-02-04 LAB — URINALYSIS, MICROSCOPIC ONLY
Bilirubin Urine: NEGATIVE
Glucose, UA: NEGATIVE mg/dL
Hgb urine dipstick: NEGATIVE
Ketones, ur: NEGATIVE mg/dL
Nitrite: NEGATIVE
Protein, ur: NEGATIVE mg/dL
Specific Gravity, Urine: 1.008 (ref 1.005–1.030)
Urobilinogen, UA: 0.2 mg/dL (ref 0.0–1.0)
pH: 7 (ref 5.0–8.0)

## 2011-02-04 LAB — CBC
HCT: 36.6 % (ref 36.0–46.0)
HCT: 40.5 % (ref 36.0–46.0)
Hemoglobin: 12.3 g/dL (ref 12.0–15.0)
Hemoglobin: 13.4 g/dL (ref 12.0–15.0)
Hemoglobin: 14.1 g/dL (ref 12.0–15.0)
MCHC: 33.2 g/dL (ref 30.0–36.0)
MCHC: 33.2 g/dL (ref 30.0–36.0)
MCHC: 33.6 g/dL (ref 30.0–36.0)
MCV: 84.6 fL (ref 78.0–100.0)
MCV: 85.4 fL (ref 78.0–100.0)
Platelets: 210 K/uL (ref 150–400)
Platelets: 238 K/uL (ref 150–400)
RBC: 4.33 MIL/uL (ref 3.87–5.11)
RBC: 4.74 MIL/uL (ref 3.87–5.11)
RDW: 15 % (ref 11.5–15.5)
RDW: 15.1 % (ref 11.5–15.5)
RDW: 15.5 % (ref 11.5–15.5)
WBC: 5.1 K/uL (ref 4.0–10.5)
WBC: 6.4 K/uL (ref 4.0–10.5)

## 2011-02-04 LAB — PROTIME-INR
INR: 1.5 (ref 0.00–1.49)
INR: 2.2 — ABNORMAL HIGH (ref 0.00–1.49)
INR: 3.1 — ABNORMAL HIGH (ref 0.00–1.49)
INR: 4 — ABNORMAL HIGH (ref 0.00–1.49)
Prothrombin Time: 17.9 s — ABNORMAL HIGH (ref 11.6–15.2)
Prothrombin Time: 18.5 seconds — ABNORMAL HIGH (ref 11.6–15.2)
Prothrombin Time: 23.9 s — ABNORMAL HIGH (ref 11.6–15.2)
Prothrombin Time: 32 s — ABNORMAL HIGH (ref 11.6–15.2)

## 2011-02-04 LAB — CARDIAC PANEL(CRET KIN+CKTOT+MB+TROPI)
CK, MB: 2.7 ng/mL (ref 0.3–4.0)
CK, MB: 3.5 ng/mL (ref 0.3–4.0)
CK, MB: 3.8 ng/mL (ref 0.3–4.0)
Relative Index: INVALID (ref 0.0–2.5)
Relative Index: INVALID (ref 0.0–2.5)
Relative Index: INVALID (ref 0.0–2.5)
Total CK: 53 U/L (ref 7–177)
Total CK: 58 U/L (ref 7–177)
Total CK: 75 U/L (ref 7–177)

## 2011-02-04 LAB — BASIC METABOLIC PANEL
CO2: 32 mEq/L (ref 19–32)
CO2: 34 mEq/L — ABNORMAL HIGH (ref 19–32)
CO2: 34 mEq/L — ABNORMAL HIGH (ref 19–32)
Calcium: 8.8 mg/dL (ref 8.4–10.5)
Calcium: 8.9 mg/dL (ref 8.4–10.5)
Chloride: 100 mEq/L (ref 96–112)
Chloride: 99 mEq/L (ref 96–112)
GFR calc Af Amer: >60 mL/min (ref >60)
GFR calc Af Amer: >60 mL/min (ref >60)
Glucose, Bld: 105 mg/dL — ABNORMAL HIGH (ref 70–99)
Glucose, Bld: 115 mg/dL — ABNORMAL HIGH (ref 70–99)
Glucose, Bld: 116 mg/dL — ABNORMAL HIGH (ref 70–99)
Sodium: 138 mEq/L (ref 135–145)
Sodium: 140 mEq/L (ref 135–145)
Sodium: 140 mEq/L (ref 135–145)

## 2011-02-04 LAB — LIPID PANEL
Cholesterol: 182 mg/dL (ref 0–200)
HDL: 51 mg/dL (ref >39)
LDL Cholesterol: 101 mg/dL — ABNORMAL HIGH (ref 0–99)
Total CHOL/HDL Ratio: 3.6 RATIO
Triglycerides: 150 mg/dL — ABNORMAL HIGH (ref <150)
VLDL: 30 mg/dL (ref 0–40)

## 2011-02-04 LAB — CK TOTAL AND CKMB (NOT AT ARMC)
CK, MB: 3.3 ng/mL (ref 0.3–4.0)
Relative Index: INVALID (ref 0.0–2.5)
Total CK: 85 U/L (ref 7–177)

## 2011-02-04 LAB — TROPONIN I

## 2011-02-04 LAB — POCT CARDIAC MARKERS
CKMB, poc: 1.9 ng/mL (ref 1.0–8.0)
Myoglobin, poc: 89.6 ng/mL (ref 12–200)
Troponin i, poc: <0.05 ng/mL (ref 0.00–0.09)

## 2011-02-04 LAB — BRAIN NATRIURETIC PEPTIDE: Pro B Natriuretic peptide (BNP): 401 pg/mL — ABNORMAL HIGH (ref 0.0–100.0)

## 2011-02-04 LAB — TSH: TSH: 0.605 u[IU]/mL (ref 0.350–4.500)

## 2011-02-04 LAB — HEPATIC FUNCTION PANEL
ALT: 20 U/L (ref 0–35)
AST: 26 U/L (ref 0–37)
Indirect Bilirubin: 1 mg/dL — ABNORMAL HIGH (ref 0.3–0.9)
Total Protein: 6.9 g/dL (ref 6.0–8.3)

## 2011-02-04 LAB — GLUCOSE, CAPILLARY: Glucose-Capillary: 112 mg/dL — ABNORMAL HIGH (ref 70–99)

## 2011-03-14 NOTE — Discharge Summary (Signed)
NAME:  Gina [name], WHICKER NO.:  [id number]   MEDICAL RECORD NO.:  [id number]          PATIENT TYPE:  INP   LOCATION:  2019                         FACILITY:  [facility]   PHYSICIAN:  Richard A. Alanda Amass, M.D.DATE OF BIRTH:  [birth year]   DATE OF ADMISSION:  07/05/2007  DATE OF DISCHARGE:  07/11/2007                               DISCHARGE SUMMARY   COMMENT:  The original discharge date was going to be July 09, 2007,  but the patient was kept until July 11, 2007 secondary to difficult  mobility and decreased oxygen saturation.  She will go home in addition  to previous medicines with oxygen at 2-L nasal cannula..  The patient  still is very lethargic at times.   ADDITIONAL HOSPITAL COURSE:  Originally, once she moved out of the CCU  to telemetry, heart rate came up to the 60s.  Her amiodarone had been  restarted at 100 mg daily.  She then had a low-dose beta blocker  restarted and heart rates again dropped down into the 40s.  Along with  that was her oxygen saturation that was low, so we went ahead and got  her home oxygen and added an ACE inhibitor for a little better blood  pressure control.   DISCHARGE MEDICATIONS:  1. Amiodarone 100 mg daily.  2. Metoprolol have been stopped; when restarted in the hospital, heart      rate again dropped down to the 40s.  3. Prilosec 20 mg a day.  4. Synthroid 0.137 mg a day.  5. Aspirin 81 mg a day.  6. Coumadin 4 mg a day as directed; will follow up as an outpatient.  7. Crestor 2.5 daily.  8. Potassium has been discontinued.  9. Magnesium 400 mg once daily.  10.Lasix is at 20 mg a day.  11.Altace 2.5 mg daily.  12.Oxygen at 2-L nasal cannula.   FOLLOWUP:  The patient will follow up with Dr. Alanda Amass hopefully in a  week.  She will call if she has further problems.      [name]. [place], N.P.      Richard A. Alanda Amass, M.D.  Electronically Signed   LRI/MEDQ  D:  07/11/2007  T:  07/12/2007  Job:  [id number]   cc:   Vale Haven. Andrey Campanile, M.D.

## 2011-03-14 NOTE — Discharge Summary (Signed)
NAME:  Gina [name], MARKERT NO.:  [id number]   MEDICAL RECORD NO.:  [id number]          PATIENT TYPE:  INP   LOCATION:  2019                         FACILITY:  [facility]   PHYSICIAN:  Richard A. Alanda Amass, M.D.DATE OF BIRTH:  1925/06/10   DATE OF ADMISSION:  07/05/2007  DATE OF DISCHARGE:  07/09/2007                               DISCHARGE SUMMARY   DISCHARGE DIAGNOSES:  1. Atrial fibrillation, DC cardioversion to sinus rhythm this      admission with significant bradycardia, improved at discharge.  2. Coumadin therapy with therapeutic INR at discharge 2.7.  3. Coronary disease, coronary artery bypass grafting in 1999 with      catheterization in 2006 showing patent grafts.  4. Hypertrophic cardiomyopathy with small hyperdynamic ventricle.  5. Treated dyslipidemia with statin intolerance at higher doses.  6. Treated hypothyroidism.   HOSPITAL COURSE:  Ms. Gina [name] is a pleasant, 77 year old female  followed by Dr. Alanda Amass with a history of coronary disease.  She had  bypass surgery in 1998 and was cathed in June 2006 and had a patent  graft.  She has had problems with atrial fibrillation with edema and  symptoms.  She does have a hypertrophic-type cardiomyopathy with a small  hyperdynamic ventricle. We put her on amiodarone.  She was seen as an  outpatient on June 27, 2007 and felt to be ready for cardioversion.  The patient was admitted on July 05, 2007 for elective DC  cardioversion.  This was performed by Dr. Alanda Amass.  Post cardioversion  she was in bradycardia with a heart rate of 35.  We held her beta  blocker and amiodarone.  She was watched closely on telemetry. He heart  rate gradually picked up.  Coumadin was resumed.  She was watched over  the weekend and we feel she can be discharged July 09, 2007.  Dr.  Alanda Amass has resumed her amiodarone at lower dose.  We will continue to  hold her beta-blocker for now.  Her INR is 2.7.   DISCHARGE  MEDICATIONS:  1. Amiodarone 100 mg a day.  2. Metoprolol has been stopped.  3. Prilosec 20 mg a day.  4. Synthroid 0.137 mg a day.  5. Aspirin 81 mg a day.  6. Coumadin 4 mg a day or as directed.  7. Crestor 2.5 mg a day.  8. Potassium 20 mEq a day.  9. Magnesium 400 mg once a day.  10.Lasix has been resumed at a lower dose of 20 mg a day.   Her potassium has been over 4 and since her Lasix was cut back will stop  that for now.   LABS:  INR at discharge is 2.7. Chest x-ray shows a left basilar  atelectasis, cardiomegaly. TSH is 1.93, magnesium 2.2, cholesterol 146,  LDL 89, HDL 35.  Liver functions are normal. Sodium 137, potassium 4.2,  BUN 16, creatinine 1.03. White count 6.4, hemoglobin 12.5, hematocrit  38.2, platelets 181. Telemetry at discharge shows sinus rhythm 60.   DISPOSITION:  The patient is discharged in stable condition and will  have a protime in a week.  She will follow  up with Dr. Alanda Amass in a  couple weeks in the office.      Abelino Derrick, P.A.      Richard A. Alanda Amass, M.D.  Electronically Signed    LKK/MEDQ  D:  07/09/2007  T:  07/09/2007  Job:  [id number]

## 2011-03-14 NOTE — Discharge Summary (Signed)
NAME:  Gina [name], Gina [name] NO.:  [id number]   MEDICAL RECORD NO.:  [id number]          PATIENT TYPE:  INP   LOCATION:  2926                         FACILITY:  [facility]   PHYSICIAN:  Richard A. Alanda Amass, M.D.DATE OF BIRTH:  [birth year]   DATE OF ADMISSION:  06/15/2009  DATE OF DISCHARGE:  06/20/2009                               DISCHARGE SUMMARY   DISCHARGE DIAGNOSES:  1. Paroxysmal atrial fibrillation with sick sinus component,      discontinued cardioversion this admission.  2. Baseline bradycardia.  3. Coumadin therapy.  4. Chronic obstructive pulmonary disease.  5. Coronary artery disease with coronary artery bypass graft in 1998,      catheterization in 2006, plans for medical therapy.   HOSPITAL COURSE:  The patient is a pleasant 77 year old female who had  bypass surgery in 1998.  She has good LV function.  She was cathed in  2006, and treated medically.  She has had atrial fibrillation and has  had previous atrial flutter ablation in September 2009, at [facility].  She  is on amiodarone at home, but this had to be cut back to 100 mg a day  because of some bradycardia.  She was admitted on June 15, 2009, with  chest pain and tachycardia.  Enzymes were negative for an MI.  Her INR  was 4 on admission.  She was given IV bolus of amiodarone and put on a  drip.  Her rate was controlled, but she remained in atrial flutter.  After reviewing with Dr. Alanda Amass, it was decided to proceed with a  cardioversion, this was done on June 16, 2009, without TEE as she has  been therapeutic on her Coumadin as an outpatient.  Postprocedure, she  was bradycardic with heart rates in the 40s.  She was relatively  asymptomatic with this.  She was watched on telemetry.  Her rate dropped  into the mid 40s on June 18, 2009, in the afternoon while she was  sleeping in a chair.  Ultimately, we had to cut back again on her  amiodarone 200 mg a day, which is what she was on prior to  admission.  Dr. Tresa Endo feels she can be discharged on June 20, 2009.  She will  follow up with Dr. Alanda Amass.  Her INR did drift subtherapeutic and she  was put on Lovenox in the last couple of days of her admission.  We did  give her a dose of Lovenox on the day of discharge and she has gotten 2  days of higher dose Coumadin than usual.  Her INR today is 1.5.  She  will have a Protime on Wednesday after discharge.   LABORATORIES:  White count 5.1, hemoglobin 12.3, hematocrit 36.6, and  platelets 210.  INR 1.5.  Sodium 138, potassium 4.4.  BUN 11, and  creatinine 0.9.  Lipids:  Cholesterol is 182, HDL 51, and LDL 101.  TSH  is 0.6.  BNP was 401.  Chest x-ray shows cardiac enlargement and COPD.   DISCHARGE MEDICATIONS:  1. Nitroglycerin 0.4 mg sublingual p.r.n.  2. Tylenol 2 q.4 h. p.r.n.  3.  Aspirin 81 mg a day.  4. Coumadin 4 mg a day.  5. Crestor 5 mg a day.  6. Lasix 40 mg a day.  7. Magnesium oxide 400 mg a day.  8. Synthroid 0.1 mg a day.  9. Amiodarone 100 mg a day.  10.Multivitamin daily.  11.Valsartan 80 mg a day has been added.   DISPOSITION:  The patient is discharged in stable condition and will  follow up with Dr. Alanda Amass.  She will need event monitor as an  outpatient.      Abelino Derrick, P.A.      Richard A. Alanda Amass, M.D.  Electronically Signed    LKK/MEDQ  D:  06/20/2009  T:  06/21/2009  Job:  [id number]   cc:   Gerlene Burdock A. Alanda Amass, M.D.  Deirdre Peer. Polite, M.D.

## 2011-03-14 NOTE — Op Note (Signed)
NAME:  Gina [name], Gina [name] NO.:  [id number]   MEDICAL RECORD NO.:  [id number]          PATIENT TYPE:  OIB   LOCATION:  2855                         FACILITY:  [facility]   PHYSICIAN:  Richard A. Alanda Amass, M.D.DATE OF BIRTH:  Apr 08, 1925   DATE OF PROCEDURE:  07/05/2007  DATE OF DISCHARGE:                               OPERATIVE REPORT   PROCEDURE:  DC cardioversion.   The patient's history is well outlined in the chart and she is a long-  term patient of mine.  She is a married African American mother of two  with two grandchildren and is accompanied by her granddaughter, who  usually accompanies her to the office.   The patient has a history of remote CABG x1 in 1998 with LIMA to LAD.  She had patent graft in June 2006, on diagnostic recatheterization with  50% OM2 narrowing, normal dominant RCA and 75% proximal LAD that had not  progressed with good filling antegrade and through her sequential graft  to the diagonal and LAD, widely patent through the LIMA.   She has had normal LV systolic function and significant concentric  hypertrophy with hyperdynamic left ventricle and physiology compatible  with nonobstructive HCM.  She has done well long-term but presented last  fall with new onset atrial fibrillation.  This was symptomatic with  symptoms of mild congestive heart failure and progressive edema.  She  was treated medically with rate control of her AF and put on amiodarone  therapy and continued on long-term Coumadin therapy.  She improved with  medical therapy.  Her edema was resolved and her shortness of breath and  symptoms of heart failure improved significantly.  They were thought to  be rate related and also loss of probable loss of atrial kick with AF  along with her diastolic dysfunction.  Prior 2-D echo showed moderate TR  with no significant valve disease.  She had a negative Cardiolite for  ischemia January 2007.   In the interim, she has had right hip  replacement January 29, 2007,  successfully and uncomplicated.   She is admitted now for DC cardioversion on chronic amiodarone therapy.   Informed consent was obtained from the patient and her daughter to  proceed with DC cardioversion after risks, benefits, and alternatives of  the procedure were explained.   Preoperative labs showed normal CBC and differential, therapeutic INR is  documented as an outpatient and 2.2 on the day of the procedure and BUN  and creatinine 27/1.5 with potassium 4.2.   Patient was given 125 mg of IV Pentothal anesthesia by Dr. Noreene Larsson.  She  had anterior/posterior paddles and underwent DC cardioversion with  synchronized external biphasic DC shock at 125 and then 200 joules.  She  converted to sinus bradycardia in the mid 30s (37 per minute).  Associated with this was relative hypotension with blood pressure 75/50.  Because of her physiology and relative bradycardia, she was given a  fluid bolus of 150 mL of normal saline.  She awoke from anesthesia with  no neurologic deficit.  Her pressure slowly came up to 100 with fluid  bolus but she had persistent sinus bradycardia at 35-40 per minute with  normal QRS.   It was felt best to admit her to the hospital for observation, status  post cardioversion, because of persistent bradycardia and relative  hypotension.  She will be given careful fluid administration and we will  hold her amiodarone and beta-blocker at present because of sinus  bradycardia.   With her sick sinus syndrome, if sinus bradycardia persists, she may  require consideration for permanent pacemaker therapy.   Her examination shows no significant edema, palpable pedal pulses.  There was hyperpigmentation of her lower extremities and skin changes  compatible with chronic venous insufficiency.   She has relatively diffuse vitiligo which is chronic.  Blood pressure at  present is 88/60 and she is easily arousable, recovering from   anesthesia, sinus bradycardia at 38 per minute.  She has a short 2/6  systolic murmur at the left sternal border, no diastolic murmur or rubs.  There is minimal JVD but she is comfortable at 10-15 degrees.   EKG shows sinus bradycardia at 35 per minute with nonspecific STT  changes and first degree heart block with PR interval 0.32, low voltage  in the limb leads which is chronic and QRS duration of 0.86.      Richard A. Alanda Amass, M.D.  Electronically Signed     RAW/MEDQ  D:  07/05/2007  T:  07/05/2007  Job:  [id number]

## 2011-03-17 NOTE — Op Note (Signed)
NAME:  Gina [name], Gina [name]                       ACCOUNT NO.:  [id number]   MEDICAL RECORD NO.:  [id number]                   PATIENT TYPE:  INP   LOCATION:  2899                                 FACILITY:  [facility]   PHYSICIAN:  Lorre Munroe., M.D.            DATE OF BIRTH:  1924/11/23   DATE OF PROCEDURE:  08/26/2003  DATE OF DISCHARGE:                                 OPERATIVE REPORT   PREOPERATIVE DIAGNOSIS:  Incisional hernia.   POSTOPERATIVE DIAGNOSES:  Incisional hernia.   OPERATION:  Laparoscopic repair of incisional hernia.   SURGEON:  Lebron Conners, M.D.   ANESTHESIA:  General.   DESCRIPTION OF PROCEDURE:  After the patient was monitored and anesthetized  and had a Foley catheter, a routine preparation and draping of the abdomen.  I first made a 2.0 cm incision in the right upper quadrant of the abdomen  and dissected down through the fat, and incised the superficial abdominal  fascia.  I then split the muscle layers with clamps, and pulled up the  transversalis fascia and incised that, and bluntly entered the peritoneal  cavity.  I swept my finger around, to be sure that there were no adhesions  in that area.  I placed a #0 Vicryl pursestring suture through the muscles  and fascia, and then inserted a Hasson cannula and inflated the abdomen with  CO2.  That massively inflated a large incisional hernia, associated with her  lower midline incision.  I put in the scope and saw that there were a lot of  adhesions of omentum and bowel into the hernia, but it appeared that it  would be feasible to take them down, as there was plenty of free space  laterally.  I put in a 10 mm port in the right lower quadrant, a 5 mm port  in the right mid-abdomen, and a 5 mm port in the left mid-abdomen, putting  them in under direct vision.  Using the Harmonic scalpel and traction, I  took down the adhesions, taking care to avoid injury to the bowel.  I  completely freed the hernia of  adhesions and could see that it was a large  hernia defect.  Using a spinal needle to be sure I was marking the edges of  the fascia on the skin near where they actually existed, I marked the hernia  and found that the defect was approximately 15 cm x 25 cm.  I then marked  that on the skin.  I selected a piece of polyester mesh with a plastic  coating on one side, and put #4-0 Vicryl sutures in it, then marked it for  orientation with several marks, and then placed it into the abdomen through  the Hasson port.  I then spread it out and had it correctly oriented.  Then  using a suture finder, I pulled up the sutures through small stab incisions  in four quadrants, and  tied it up.  It seemed to spread out and cover the  hernia defect nicely.  Using the auto suture pro-tacker, I then attached the  mesh all the way around, shifting the camera and ports as necessary, to  achieve good vision, and felt that I had adequately secured the mesh all the  way around.  It actually went from down around the pubic symphysis up to the  falciform ligament, well above the umbilicus and quite far laterally on each  side.  After making sure that hemostasis was good, that the bowel appeared  to be uninjured, and that the mesh seemed to be secure, I removed all of the  ports except for the original Hasson port under direct vision, and allowed  the CO2 to escape, and removed  that port and tied the pursestring suture.  I closed all of the skin  incisions with intracuticular #4-0 Vicryl and Steri-Strips.  We applied  bandages and an abdominal binder.  She tolerated the operation well.                                                Lorre Munroe., M.D.    WB/MEDQ  D:  08/26/2003  T:  08/26/2003  Job:  [id number]   cc:   Vale Haven. Andrey Campanile, M.D.  [address]  [place]  [place] [address]  Fax: [phone]   Sharrell Ku, M.D.

## 2011-03-17 NOTE — Op Note (Signed)
NAME:  Gina [name], Gina [name]                       ACCOUNT NO.:  [id number]   MEDICAL RECORD NO.:  [id number]                   PATIENT TYPE:  INP   LOCATION:  0006                                 [facility]ACILITY[facility]   PHYSICIAN:  Lorre Munroe., M.D.            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   OPERATION:  1. Sigmoid colectomy.  2. Right salpingo-oophorectomy.   SURGEON:  Zigmund Daniel, M.D.   ASSISTANT:  Donnie Coffin. Samuella Cota, M.D.   ANESTHESIA:  General.   DESCRIPTION OF PROCEDURE:  After the patient was monitored and anesthetized  and had routine positioning, preparation, and draping of the abdomen and  perineum, I made a little midline incision from just below the umbilicus  down to just above the symphysis.  I then deepened the dissection through  the fat down to the midline fascia and opened it in the midline and  carefully entered the peritoneum.  There were no adhesions to the anterior  abdominal wall.  Briefly explored the abdomen, finding no abnormalities in  the upper abdomen and finding that there was a large ,inflammatory mass in  the pelvis.  I packed the small intestine up out of the way and then began  mobilization of the sigmoid colon.  It was very densely stuck to the pelvic  sidewall and to the bladder and involved both the left and right ovary and  tubes which had been left in place at the time of her hysterectomy.  Work  was quite tedious.  After working some simply to try to free up the  inflammatory mass, I decided I would need to work behind the colon, so I  divided the colon in the mid sigmoid with cutting stapler and then went  straight down through the mesentery to the area  just at the promontory of  the sacrum.  I dissected laterally and identified the left and right  ureters.  I made sure that I did not harm them.  I mobilized the proximal  colon adequately for subsequent anastomosis.  After dissecting distally on  the left side, it became obvious that the tube and ovary were densely  adherent in the mass, and so I went lateral to that, freed up the tube and  ovary and divided the ovarian vein and ligated it with 2-0 silk.  I then  dissected back toward the midline and directly down through the most dense  area of adhesion, went into the fistula.  There was only a very small hole  into the vaginal cuff, and it did not require any suture.  After dividing  the fistula, I had fairly good mobility of the  sigmoid colon, and there was  plenty of healthy rectum distally.  I worked further to divide the mesentery  and then marked the distal bowel with two sutures and divided it and removed  the specimen.  I found that the descending colon came down nicely, and I  would be able to do a primary anastomosis.  I copiously irrigated the  pelvis, then removed the irrigant, made sure hemostasis was good and that  the uterus were not harmed.  I then did an end-to-end anastomosis with a  single layer of interrupted 3-0 silk sutures.  The vascularity of the bowel  was good, and I was very well satisfied with the caliber of the anastomosis  and its integrity.  Sponge, needle, and instrument counts were correct.  I  closed the mesenteric defect with a few sutures of 3-0 silk.  There was no  sign of bleeding at the time of closure.  I closed the abdominal fascia with  running #1 PDS, and I closed the skin with staples after irrigating the  subcutaneous tissues.  The patient tolerated the operation well.                                               Lorre Munroe., M.D.    WB/MEDQ  D:  04/21/2003  T:  04/21/2003  Job:  [id number]   cc:   Reuel Boom L. Eda Paschal, M.D.  [address], [address]  [place]  [place] [address]  Fax: [phone]   Griffith Citron, M.D.  [facility] [place]  [place] [address]  Fax: [phone]   Vale Haven. Andrey Campanile, M.D.  [address]  [place]  [place] [address]  Fax: [phone]

## 2011-03-17 NOTE — Discharge Summary (Signed)
NAME:  JEFFRIESPaytin, Ramakrishnan NO.:  [id number]   MEDICAL RECORD NO.:  [id number]          PATIENT TYPE:  INP   LOCATION:  4740                         FACILITY:  [facility]   PHYSICIAN:  Burnard Bunting, M.D.    DATE OF BIRTH:  April 10, 1925   DATE OF ADMISSION:  01/29/2007  DATE OF DISCHARGE:  02/05/2007                               DISCHARGE SUMMARY   DISCHARGE DIAGNOSIS:  Right hip arthritis.   SECONDARY DIAGNOSES:  1. Hypertension.  2. Coronary artery disease, status post coronary artery bypass graft      in 1998.  3. Atrial fibrillation on chronic Coumadin.  4. Hypothyroidism.  5. History of diverticulitis.  6. History of gout.  7. History of gastroesophageal reflux disease.   OPERATION/PROCEDURE:  Right total hip replacement, performed January 29, 2007.   HOSPITAL COURSE:  Avaya Mcjunkins is a 77 year old, ambulatory, female  with end-stage right hip arthritis.  She underwent right total hip  replacement on January 29, 2007.  She tolerated the procedure well without  immediate complications.  She was transfused 2 units of packed red blood  cells for a postop hemoglobin of 7.9.  She was started on Coumadin for  DVT prophylaxis.  She had 1 episode of desaturation on postop day number  1.  She was transferred to telemetry for rate control of atrial  fibrillation and further monitoring.  She had no other postoperative  events.  Incision was intact on postop day number 3.  Radiograph showed  good position of components.  She was ambulating in the halls with a  walker, weight bearing as tolerated by date of discharge.  She was  maintained on heparin until her INR became therapeutic at 2.1 on February 05, 2007.  She is discharged to skilled nursing facility, weight bearing  as tolerated with observation of hip precautions.   DISCHARGE MEDICATIONS:  Include:  1. Coumadin 6 mg p.o. daily to an INR of 2.0 to 2.5.  2. Amiodarone 200 mg p.o. daily.  3. Multivitamin 1 tab p.o.  daily.  4. Protonix 40 mg p.o. daily.  5. Demadex 20 mg p.o. daily.  6. Lipitor 20 mg p.o. daily.  7. Synthroid 125 mcg p.o. daily.  8. Metoprolol 50 mg p.o. b.i.d.  9. Colace 100 mg p.o. daily.  10.Trinsicon __________ cap 1 p.o. daily __________.  11.Magnesium oxide 400 mg p.o. t.i.d.  12.Percocet 1-2 p.o. q.4 hours p.r.n. pain.  13.Robaxin 500 mg p.o. q.6 hours p.r.n. spasm.  14.Colchicine 0.6 mg p.o. p.r.n.  15.augmentin 875 po bid for 7 days  She will need to keep the incision dry.  She will follow up with me in 7  days for a suture removal.      Burnard Bunting, M.D.  Electronically Signed     GSD/MEDQ  D:  02/05/2007  T:  02/05/2007  Job:  [id number]

## 2011-03-17 NOTE — Discharge Summary (Signed)
   NAME:  Gina [name], Gina [name]                       ACCOUNT NO.:  [id number]   MEDICAL RECORD NO.:  [id number]                   PATIENT TYPE:  INP   LOCATION:  0355                                 [facility]ACILITY[facility]   PHYSICIAN:  Lorre Munroe., M.D.            DATE OF BIRTH:  04/01/1925   DATE OF ADMISSION:  04/21/2003  DATE OF DISCHARGE:  04/28/2003                                 DISCHARGE SUMMARY   HISTORY:  This is a 77 year old black female who developed recent foul-  smelling vaginal discharge.  Dr. Karma Ganja, Dr. Eda Paschal, and Dr. Kinnie Scales  have seen the patient, and all agreed she had evidence of a colovaginal  fistula.  She has had a TAH and BSO in the past.  There is a history of  diverticulosis and diverticulitis.  The patient was admitted after a bowel  prep for colon resection.   PAST MEDICAL HISTORY:  Positive for coronary artery disease.   PAST SURGICAL HISTORY:  No operations except for hysterectomy.   MEDICATIONS:  1. Atacand.  2. Metoprolol.  3. Zocor.  4. Demadex.  5. Prilosec.  6. Synthroid.   See the history and physical for further details.   HOSPITAL COURSE:  On the day of admission, the patient underwent a sigmoid  colectomy.  The fistula was identified and was not complex.  Primary  anastomosis was performed.  The patient's recovery was generally quick, and  she had no detected complications.  Pathology was benign.  When she was able  to tolerated adequate food and was comfortable on oral pain medicine, she  was discharged.  The wound healed primarily.  She is to follow up with me in  the office for removal of the remaining staples in about a week.  Most of  the staples have been removed prior to discharge.   DIAGNOSIS:  1. Sigmoid diverticulitis with colovaginal fistula.  2. Coronary artery disease, stable.  3. Hyperlipidemia.  4. Hypertension.  5. Gastroesophageal reflux disease.   OPERATIONS:  Sigmoid colectomy.   DISCHARGE CONDITION:   Improved.                                               Lorre Munroe., M.D.    WB/MEDQ  D:  06/07/2003  T:  06/07/2003  Job:  [id number]   cc:   Vale Haven. Andrey Campanile, M.D.  [address]  [place]  [place] [address]  Fax: [phone]   Griffith Citron, M.D.  [facility] [place]  [place] [address]  Fax: [phone]   Rande Brunt. Eda Paschal, M.D.  [address]  [place]  [place] [address]  Fax: [phone]

## 2011-03-17 NOTE — Discharge Summary (Signed)
NAME:  [name], [name] NO.:  [id number]   MEDICAL RECORD NO.:  [id number]            PATIENT TYPE:   LOCATION:                                 FACILITY:   PHYSICIAN:  Burnard Bunting, M.D.         DATE OF BIRTH:   DATE OF ADMISSION:  DATE OF DISCHARGE:                               DISCHARGE SUMMARY   ADDENDUM   Gina [name] is a 77 year old patient who underwent right total hip  replacement.  There is a question of possible development of pneumonia  in the left lower quadrant.  Since the end of the last Discharge  Summary, the patient has been treated with diuresis over the past 48  hours as well as antibiotics were initiated.  O2 sats 100% on 2 L at the  time of discharge.  She has been afebrile.  INR is therapeutic at 2.5.  The patient underwent treatment with IV Levaquin for two days on April 9  and April 10, and will receive a seven day course of Augmentin as well.  She will follow up with me next week for suture removal.  The remainder  of her discharge medications remain the same.      Burnard Bunting, M.D.  Electronically Signed     GSD/MEDQ  D:  02/07/2007  T:  02/07/2007  Job:  [id number]

## 2011-03-17 NOTE — Discharge Summary (Signed)
NAME:  Gina, [name] NO.:  [id number]   MEDICAL RECORD NO.:  [id number]          PATIENT TYPE:  INP   LOCATION:  6525                         FACILITY:  [facility]   PHYSICIAN:  Abelino Derrick, P.A.   DATE OF BIRTH:  Apr 03, 1925   DATE OF ADMISSION:  04/12/2005  DATE OF DISCHARGE:  04/14/2005                                 DISCHARGE SUMMARY   DISCHARGE DIAGNOSIS:  1.  Chest pain consistent with unstable angina, patent grafts at      catheterization this admission.  2.  Coronary artery disease with coronary artery bypass grafting in the      past.  3.  Treated hypertension.  4.  Treated hypothyroid.  5.  Treated hyperlipidemia.   HOSPITAL COURSE:  [name] is a 77 year old female followed by Dr.  Alanda Amass.  She had bypass surgery five years ago.  She has not seen Dr.  Alanda Amass in sometime.  She has been doing well without cardiac problems  until the day of admission.  On the day of admission, she developed some  epigastric pressure followed by profuse diaphoresis.  She was seen in the  emergency room.  Her EKG showed no acute changes.  Her enzymes were  negative.  She was started on Lovenox.  Enzymes continued to cycle negative.  Because of her worrisome history, it was decided to proceed with a  diagnostic catheterization.  This was done April 13, 2005, by Dr. Elsie Lincoln.  This revealed a patent LIMA to the LAD and diagonal.  A 50% OM1 with,  otherwise, normal circumflex and normal RCA.  She had good LV function.  The  native LAD had a 75% proximal lesion.  The plan was for continued medical  therapy.  We felt she could be discharged April 14, 2005.  We did add  Protonix for one month and then she should take Prilosec over the counter.   LABORATORY DATA:  Stool was negative for blood.  CK MB and troponins were  negative.  Sodium 133, potassium 4.6, BUN 23, creatinine 1.3, magnesium 1.7,  white count 13.4, hemoglobin 14.1, hematocrit 43, platelets 314.  TSH  initially was 10 but a repeat TSH was 3.9.  Lipid panel shows a cholesterol  of 207, triglycerides 105, HDL 55, LDL 111.  Chest x-ray shows some elevated  left hemidiaphragm and cardiomegaly, no acute changes.  Her EKG showed sinus  rhythm, nonspecific ST changes, septal T waves.  These are apparently new  from 2004.   DISPOSITION:  The patient is discharged in stable condition and will follow  up with Dr. Alanda Amass and Dr. Andrey Campanile.  At some point, we may want to  consider changing her to Vytorin although she did not have significant  coronary disease progression at catheterization this admission.       LKK/MEDQ  D:  04/14/2005  T:  04/14/2005  Job:  [id number]   cc:   Vale Haven. Andrey Campanile, M.D.  [address]  [place]  [place] [address]  Fax: [phone]

## 2011-03-17 NOTE — Cardiovascular Report (Signed)
NAME:  Gina [name], Gina [name] NO.:  [id number]   MEDICAL RECORD NO.:  [id number]          PATIENT TYPE:  INP   LOCATION:  6525                         FACILITY:  [facility]   PHYSICIAN:  Madaline Savage, M.D.DATE OF BIRTH:  May 21, 1925   DATE OF PROCEDURE:  04/13/2005  DATE OF DISCHARGE:                              CARDIAC CATHETERIZATION   PROCEDURES PERFORMED:  1.  Selective coronary angiography by Judkins technique.  2.  Retrograde left heart catheterization.  3.  Left ventricular angiography.  4.  Selective visualization of the left internal mammary artery graft.   COMPLICATIONS:  None.   ENTRY SITE:  Right femoral.   DYE USED:  Omnipaque.   PATIENT PROFILE:  [name] is a 77 year old African-American woman who  is a cardiology patient of Dr. Gerlene Burdock A. Alanda Amass who has had previous  coronary bypass grafting. She entered the hospital on April 12, 2005 with  indigestion, diaphoresis that came on with rest and resolved quickly. She  ultimately came to the hospital because of these symptoms. She had coronary  bypass grafting 5 years ago. Today's procedure was performed on inpatient  basis after her cardiac enzymes have been negative for myocardial  infarction. Her EKG shows sinus bradycardia at a rate of 58 a minute, Q-  waves V1 and V2, T-wave inversions V1-V2 compatible with old septal  infarction.   RESULTS:  Left ventricular pressure was 110/11, end-diastolic pressure 20,  central aortic pressure 110/65, mean of 85. No aortic valve gradient by  pullback technique.   ANGIOGRAPHIC RESULTS:  The patient's left main coronary artery is a very  large vessel, a long vessel and is normal.   The LAD courses to the cardiac apex and gives rise to a fairly large  diagonal branch. There is disease in the midportion of the LAD that is hard  to sort out from the adjacent vessels. There is also a 75% stenosis in the  proximal LAD just before septal perforator #1.   Left circumflex coronary artery gives rise to a medium sized circumflex with  a very proximal small obtuse marginal branch which is normal. A second small  to medium obtuse marginal branch that contains a 50% ostial stenosis and the  distal circumflex itself appears normal.   The right coronary artery is a very large vessel with some calcifications  proximally. There is a slight shepherd's crook deformity proximally. There  is no significant area of narrowing in the proximal mid or distal vessel.  The posterior descending and posterolateral branches appear normal.   The left internal mammary artery graft to LAD and diagonal is widely patent  throughout with good runoff into both vessels.   The left ventricular angiogram shows good contractility with ejection  fraction estimate 60-70% with no mitral regurgitation or LV thrombus.   FINAL DIAGNOSES:  1.  A 75% left anterior descending artery stenosis.  2.  A 50% ostial stenosis of second obtuse marginal branch.  3.  Good left ventricular systolic function.  4.  Patent sequential internal mammary artery graft to left anterior      descending artery and  diagonal.   PLAN:  Continued medical therapy.       WHG/MEDQ  D:  04/13/2005  T:  04/13/2005  Job:  [id number]   cc:   Gerlene Burdock A. Alanda Amass, M.D.  [phone] [address]  [place]  [place] [address]  Fax: [phone]   Medical records

## 2011-08-11 LAB — BASIC METABOLIC PANEL
CO2: 32
CO2: 37 — ABNORMAL HIGH
Calcium: 8.9
Calcium: 8.9
Calcium: 9.1
Chloride: 100
GFR calc Af Amer: 40 — ABNORMAL LOW
GFR calc Af Amer: 59 — ABNORMAL LOW
GFR calc Af Amer: >60
GFR calc non Af Amer: 33 — ABNORMAL LOW
GFR calc non Af Amer: 48 — ABNORMAL LOW
GFR calc non Af Amer: 52 — ABNORMAL LOW
Glucose, Bld: 105 — ABNORMAL HIGH
Glucose, Bld: 118 — ABNORMAL HIGH
Glucose, Bld: 98
Potassium: 4.2
Sodium: 137
Sodium: 139
Sodium: 140
Sodium: 143

## 2011-08-11 LAB — COMPREHENSIVE METABOLIC PANEL
CO2: 30
Calcium: 8.7
Creatinine, Ser: 1.37 — ABNORMAL HIGH
GFR calc Af Amer: 45 — ABNORMAL LOW
GFR calc non Af Amer: 37 — ABNORMAL LOW
Glucose, Bld: 114 — ABNORMAL HIGH

## 2011-08-11 LAB — LIPID PANEL: Cholesterol: 146

## 2011-08-11 LAB — CBC
HCT: 38.2
Hemoglobin: 12.2
Hemoglobin: 12.5
Hemoglobin: 13.6
MCHC: 32.4
MCHC: 32.8
MCV: 82.3
Platelets: 220
RBC: 4.89
RDW: 16 — ABNORMAL HIGH
RDW: 16 — ABNORMAL HIGH
RDW: 16.3 — ABNORMAL HIGH
RDW: 16.6 — ABNORMAL HIGH
WBC: 5.5

## 2011-08-11 LAB — PROTIME-INR
INR: 2.2 — ABNORMAL HIGH
INR: 2.7 — ABNORMAL HIGH
Prothrombin Time: 24.9 — ABNORMAL HIGH
Prothrombin Time: 25.7 — ABNORMAL HIGH
Prothrombin Time: 26.6 — ABNORMAL HIGH
Prothrombin Time: 30.4 — ABNORMAL HIGH

## 2011-08-11 LAB — TSH: TSH: 1.934

## 2011-09-15 ENCOUNTER — Encounter (HOSPITAL_COMMUNITY): Payer: Self-pay

## 2011-10-27 ENCOUNTER — Other Ambulatory Visit: Payer: Self-pay | Admitting: Cardiovascular Disease

## 2011-10-27 DIAGNOSIS — M545 Low back pain: Secondary | ICD-10-CM

## 2011-11-02 ENCOUNTER — Ambulatory Visit
Admission: RE | Admit: 2011-11-02 | Discharge: 2011-11-02 | Disposition: A | Discharge status: Home / Self Care | Payer: Medicare Other | Source: Ambulatory Visit | Attending: Cardiovascular Disease | Admitting: Cardiovascular Disease

## 2011-11-02 DIAGNOSIS — M545 Low back pain: Secondary | ICD-10-CM

## 2012-02-13 DIAGNOSIS — I422 Other hypertrophic cardiomyopathy: Secondary | ICD-10-CM

## 2012-02-13 HISTORY — DX: Other hypertrophic cardiomyopathy: I42.2

## 2012-03-02 ENCOUNTER — Emergency Department (HOSPITAL_COMMUNITY): Payer: Medicare Other

## 2012-03-02 ENCOUNTER — Encounter (HOSPITAL_COMMUNITY): Payer: Self-pay | Admitting: *Deleted

## 2012-03-02 ENCOUNTER — Inpatient Hospital Stay (HOSPITAL_COMMUNITY)
Admission: EM | Admit: 2012-03-02 | Discharge: 2012-03-05 | DRG: 175 | Disposition: A | Discharge status: Home Health | Payer: Medicare Other | Attending: Internal Medicine | Admitting: Internal Medicine

## 2012-03-02 DIAGNOSIS — I10 Essential (primary) hypertension: Secondary | ICD-10-CM

## 2012-03-02 DIAGNOSIS — Z950 Presence of cardiac pacemaker: Secondary | ICD-10-CM

## 2012-03-02 DIAGNOSIS — I4891 Unspecified atrial fibrillation: Secondary | ICD-10-CM | POA: Diagnosis present

## 2012-03-02 DIAGNOSIS — I2510 Atherosclerotic heart disease of native coronary artery without angina pectoris: Secondary | ICD-10-CM | POA: Diagnosis present

## 2012-03-02 DIAGNOSIS — R0781 Pleurodynia: Secondary | ICD-10-CM | POA: Diagnosis present

## 2012-03-02 DIAGNOSIS — E7800 Pure hypercholesterolemia, unspecified: Secondary | ICD-10-CM | POA: Diagnosis present

## 2012-03-02 DIAGNOSIS — I272 Pulmonary hypertension, unspecified: Secondary | ICD-10-CM

## 2012-03-02 DIAGNOSIS — J159 Unspecified bacterial pneumonia: Secondary | ICD-10-CM

## 2012-03-02 DIAGNOSIS — K7689 Other specified diseases of liver: Secondary | ICD-10-CM | POA: Diagnosis present

## 2012-03-02 DIAGNOSIS — I279 Pulmonary heart disease, unspecified: Secondary | ICD-10-CM | POA: Diagnosis present

## 2012-03-02 DIAGNOSIS — E079 Disorder of thyroid, unspecified: Secondary | ICD-10-CM | POA: Diagnosis present

## 2012-03-02 DIAGNOSIS — M109 Gout, unspecified: Secondary | ICD-10-CM | POA: Diagnosis present

## 2012-03-02 DIAGNOSIS — Z951 Presence of aortocoronary bypass graft: Secondary | ICD-10-CM

## 2012-03-02 DIAGNOSIS — I2699 Other pulmonary embolism without acute cor pulmonale: Principal | ICD-10-CM | POA: Diagnosis present

## 2012-03-02 DIAGNOSIS — Z96649 Presence of unspecified artificial hip joint: Secondary | ICD-10-CM

## 2012-03-02 DIAGNOSIS — J189 Pneumonia, unspecified organism: Secondary | ICD-10-CM | POA: Diagnosis present

## 2012-03-02 DIAGNOSIS — E039 Hypothyroidism, unspecified: Secondary | ICD-10-CM | POA: Diagnosis present

## 2012-03-02 DIAGNOSIS — R071 Chest pain on breathing: Secondary | ICD-10-CM

## 2012-03-02 DIAGNOSIS — I495 Sick sinus syndrome: Secondary | ICD-10-CM | POA: Diagnosis present

## 2012-03-02 HISTORY — DX: Unspecified hearing loss, unspecified ear: H91.90

## 2012-03-02 HISTORY — DX: Disorder of thyroid, unspecified: E07.9

## 2012-03-02 HISTORY — DX: Gastro-esophageal reflux disease without esophagitis: K21.9

## 2012-03-02 HISTORY — DX: Pulmonary hypertension, unspecified: I27.20

## 2012-03-02 HISTORY — DX: Atherosclerotic heart disease of native coronary artery without angina pectoris: I25.10

## 2012-03-02 HISTORY — DX: Unspecified atrial fibrillation: I48.91

## 2012-03-02 HISTORY — DX: Other disorders of lung: J98.4

## 2012-03-02 HISTORY — DX: Reserved for inherently not codable concepts without codable children: IMO0001

## 2012-03-02 HISTORY — DX: Pure hypercholesterolemia, unspecified: E78.00

## 2012-03-02 HISTORY — DX: Sick sinus syndrome: I49.5

## 2012-03-02 LAB — DIFFERENTIAL
Lymphocytes Relative: 10 % — ABNORMAL LOW (ref 12–46)
Lymphs Abs: 1.2 10*3/uL (ref 0.7–4.0)
Monocytes Relative: 11 % (ref 3–12)
Neutro Abs: 9.1 10*3/uL — ABNORMAL HIGH (ref 1.7–7.7)
Neutrophils Relative %: 79 % — ABNORMAL HIGH (ref 43–77)

## 2012-03-02 LAB — CARDIAC PANEL(CRET KIN+CKTOT+MB+TROPI)
CK, MB: 2.1 ng/mL (ref 0.3–4.0)
Troponin I: <0.3 ng/mL (ref <0.30)

## 2012-03-02 LAB — POCT I-STAT, CHEM 8
BUN: 25 mg/dL — ABNORMAL HIGH (ref 6–23)
Creatinine, Ser: 1.2 mg/dL — ABNORMAL HIGH (ref 0.50–1.10)
Potassium: 4 mEq/L (ref 3.5–5.1)
Sodium: 139 mEq/L (ref 135–145)

## 2012-03-02 LAB — URINALYSIS, ROUTINE W REFLEX MICROSCOPIC
Glucose, UA: NEGATIVE mg/dL
pH: 7 (ref 5.0–8.0)

## 2012-03-02 LAB — TSH: TSH: 1.672 u[IU]/mL (ref 0.350–4.500)

## 2012-03-02 LAB — CBC
Hemoglobin: 13.8 g/dL (ref 12.0–15.0)
RBC: 4.68 MIL/uL (ref 3.87–5.11)
WBC: 11.5 10*3/uL — ABNORMAL HIGH (ref 4.0–10.5)

## 2012-03-02 LAB — PROTIME-INR: INR: 1.26 (ref 0.00–1.49)

## 2012-03-02 LAB — D-DIMER, QUANTITATIVE: D-Dimer, Quant: 2.22 ug/mL-FEU — ABNORMAL HIGH (ref 0.00–0.48)

## 2012-03-02 LAB — POCT I-STAT TROPONIN I: Troponin i, poc: 0.02 ng/mL (ref 0.00–0.08)

## 2012-03-02 MED ORDER — SODIUM CHLORIDE 0.9 % IV BOLUS (SEPSIS)
250.0000 mL | Freq: Once | INTRAVENOUS | Status: AC
  Administered 2012-03-02: 05:00:00 via INTRAVENOUS

## 2012-03-02 MED ORDER — AMIODARONE HCL 200 MG PO TABS
200.0000 mg | ORAL_TABLET | Freq: Every day | ORAL | Status: DC
  Administered 2012-03-02 – 2012-03-05 (×4): 200 mg via ORAL
  Filled 2012-03-02 (×4): qty 1

## 2012-03-02 MED ORDER — ACETAMINOPHEN 325 MG PO TABS: 650.0000 mg | ORAL_TABLET | Freq: Four times a day (QID) | ORAL | Status: DC | PRN

## 2012-03-02 MED ORDER — POLYETHYLENE GLYCOL 3350 17 G PO PACK
17.0000 g | PACK | Freq: Every day | ORAL | Status: DC | PRN
  Filled 2012-03-02: qty 1

## 2012-03-02 MED ORDER — ASPIRIN EC 81 MG PO TBEC
81.0000 mg | DELAYED_RELEASE_TABLET | Freq: Every day | ORAL | Status: DC
  Administered 2012-03-02 – 2012-03-05 (×4): 81 mg via ORAL
  Filled 2012-03-02 (×4): qty 1

## 2012-03-02 MED ORDER — DEXTROSE 5 % IV SOLN
500.0000 mg | INTRAVENOUS | Status: DC
  Filled 2012-03-02: qty 500

## 2012-03-02 MED ORDER — SODIUM CHLORIDE 0.9 % IJ SOLN
3.0000 mL | Freq: Two times a day (BID) | INTRAMUSCULAR | Status: DC
  Administered 2012-03-02 – 2012-03-04 (×3): 3 mL via INTRAVENOUS

## 2012-03-02 MED ORDER — DEXTROSE 5 % IV SOLN
1.0000 g | INTRAVENOUS | Status: DC
  Administered 2012-03-03 – 2012-03-05 (×3): 1 g via INTRAVENOUS
  Filled 2012-03-02 (×4): qty 10

## 2012-03-02 MED ORDER — SODIUM CHLORIDE 0.9 % IV SOLN
INTRAVENOUS | Status: DC
  Administered 2012-03-02 – 2012-03-05 (×4): via INTRAVENOUS

## 2012-03-02 MED ORDER — LEVOTHYROXINE SODIUM 100 MCG PO TABS
100.0000 ug | ORAL_TABLET | Freq: Every day | ORAL | Status: DC
  Administered 2012-03-02 – 2012-03-05 (×4): 100 ug via ORAL
  Filled 2012-03-02 (×5): qty 1

## 2012-03-02 MED ORDER — ALBUTEROL SULFATE (5 MG/ML) 0.5% IN NEBU
2.5000 mg | INHALATION_SOLUTION | Freq: Three times a day (TID) | RESPIRATORY_TRACT | Status: DC
  Administered 2012-03-03 – 2012-03-05 (×8): 2.5 mg via RESPIRATORY_TRACT
  Filled 2012-03-02 (×8): qty 0.5

## 2012-03-02 MED ORDER — ATORVASTATIN CALCIUM 20 MG PO TABS
20.0000 mg | ORAL_TABLET | Freq: Every day | ORAL | Status: DC
  Administered 2012-03-02 – 2012-03-04 (×3): 20 mg via ORAL
  Filled 2012-03-02 (×4): qty 1

## 2012-03-02 MED ORDER — SODIUM CHLORIDE 0.9 % IV SOLN
INTRAVENOUS | Status: AC
  Administered 2012-03-02: 07:00:00 via INTRAVENOUS

## 2012-03-02 MED ORDER — VITAMIN D3 25 MCG (1000 UNIT) PO TABS
2000.0000 [IU] | ORAL_TABLET | Freq: Every day | ORAL | Status: DC
  Administered 2012-03-02 – 2012-03-05 (×4): 2000 [IU] via ORAL
  Filled 2012-03-02 (×4): qty 2

## 2012-03-02 MED ORDER — DEXTROSE 5 % IV SOLN
500.0000 mg | INTRAVENOUS | Status: DC
  Administered 2012-03-03 – 2012-03-05 (×3): 500 mg via INTRAVENOUS
  Filled 2012-03-02 (×4): qty 500

## 2012-03-02 MED ORDER — POTASSIUM CHLORIDE CRYS ER 20 MEQ PO TBCR
20.0000 meq | EXTENDED_RELEASE_TABLET | Freq: Every day | ORAL | Status: DC
  Administered 2012-03-02 – 2012-03-05 (×4): 20 meq via ORAL
  Filled 2012-03-02 (×4): qty 1

## 2012-03-02 MED ORDER — MAGNESIUM OXIDE 400 MG PO TABS: 400.0000 mg | ORAL_TABLET | Freq: Every day | ORAL | Status: DC

## 2012-03-02 MED ORDER — DEXTROSE 5 % IV SOLN
1.0000 g | INTRAVENOUS | Status: DC
  Filled 2012-03-02: qty 10

## 2012-03-02 MED ORDER — ENOXAPARIN SODIUM 40 MG/0.4ML ~~LOC~~ SOLN
40.0000 mg | SUBCUTANEOUS | Status: DC
  Administered 2012-03-02 – 2012-03-03 (×2): 40 mg via SUBCUTANEOUS
  Filled 2012-03-02 (×2): qty 0.4

## 2012-03-02 MED ORDER — ALBUTEROL SULFATE (5 MG/ML) 0.5% IN NEBU
2.5000 mg | INHALATION_SOLUTION | Freq: Four times a day (QID) | RESPIRATORY_TRACT | Status: DC
  Administered 2012-03-02 (×2): 2.5 mg via RESPIRATORY_TRACT
  Filled 2012-03-02 (×2): qty 0.5

## 2012-03-02 MED ORDER — WARFARIN SODIUM 5 MG PO TABS
5.0000 mg | ORAL_TABLET | Freq: Once | ORAL | Status: AC
  Administered 2012-03-02: 5 mg via ORAL
  Filled 2012-03-02: qty 1

## 2012-03-02 MED ORDER — COLCHICINE 0.6 MG PO TABS
0.6000 mg | ORAL_TABLET | Freq: Every day | ORAL | Status: DC
  Administered 2012-03-02 – 2012-03-05 (×4): 0.6 mg via ORAL
  Filled 2012-03-02 (×4): qty 1

## 2012-03-02 MED ORDER — WARFARIN - PHARMACIST DOSING INPATIENT
Freq: Every day | Status: DC
  Administered 2012-03-02: 5

## 2012-03-02 MED ORDER — HYDROCODONE-ACETAMINOPHEN 5-325 MG PO TABS
1.0000 | ORAL_TABLET | Freq: Once | ORAL | Status: AC
  Administered 2012-03-02: 1 via ORAL
  Filled 2012-03-02: qty 1

## 2012-03-02 MED ORDER — ACETAMINOPHEN 650 MG RE SUPP: 650.0000 mg | Freq: Four times a day (QID) | RECTAL | Status: DC | PRN

## 2012-03-02 MED ORDER — AZITHROMYCIN 250 MG PO TABS
500.0000 mg | ORAL_TABLET | Freq: Once | ORAL | Status: AC
  Administered 2012-03-02: 500 mg via ORAL
  Filled 2012-03-02: qty 2

## 2012-03-02 MED ORDER — MAGNESIUM OXIDE 400 (241.3 MG) MG PO TABS
400.0000 mg | ORAL_TABLET | Freq: Every day | ORAL | Status: DC
  Administered 2012-03-02 – 2012-03-05 (×4): 400 mg via ORAL
  Filled 2012-03-02 (×4): qty 1

## 2012-03-02 MED ORDER — PROPOFOL 10 MG/ML IV BOLUS: 60.0000 mg | Freq: Once | INTRAVENOUS | Status: DC

## 2012-03-02 MED ORDER — HYDROCODONE-ACETAMINOPHEN 5-325 MG PO TABS
1.0000 | ORAL_TABLET | ORAL | Status: DC | PRN
  Administered 2012-03-02: 1 via ORAL
  Administered 2012-03-03: 2 via ORAL
  Administered 2012-03-05: 1 via ORAL
  Filled 2012-03-02 (×2): qty 1
  Filled 2012-03-02: qty 2

## 2012-03-02 MED ORDER — WARFARIN SODIUM 2.5 MG PO TABS: 2.5000 mg | ORAL_TABLET | Freq: Every day | ORAL | Status: DC

## 2012-03-02 MED ORDER — ASPIRIN 81 MG PO CHEW
324.0000 mg | CHEWABLE_TABLET | Freq: Once | ORAL | Status: AC
  Administered 2012-03-02: 324 mg via ORAL
  Filled 2012-03-02: qty 4

## 2012-03-02 MED ORDER — NITROGLYCERIN 0.4 MG SL SUBL: 0.4000 mg | SUBLINGUAL_TABLET | SUBLINGUAL | Status: DC | PRN

## 2012-03-02 MED ORDER — IPRATROPIUM BROMIDE 0.02 % IN SOLN
0.5000 mg | Freq: Three times a day (TID) | RESPIRATORY_TRACT | Status: DC
  Administered 2012-03-03 – 2012-03-05 (×8): 0.5 mg via RESPIRATORY_TRACT
  Filled 2012-03-02 (×8): qty 2.5

## 2012-03-02 MED ORDER — IPRATROPIUM BROMIDE 0.02 % IN SOLN
0.5000 mg | Freq: Four times a day (QID) | RESPIRATORY_TRACT | Status: DC
  Administered 2012-03-02 (×2): 0.5 mg via RESPIRATORY_TRACT
  Filled 2012-03-02 (×2): qty 2.5

## 2012-03-02 MED ORDER — POLYSACCHARIDE IRON COMPLEX 150 MG PO CAPS
150.0000 mg | ORAL_CAPSULE | Freq: Two times a day (BID) | ORAL | Status: DC
  Administered 2012-03-02 – 2012-03-05 (×7): 150 mg via ORAL
  Filled 2012-03-02 (×8): qty 1

## 2012-03-02 MED ORDER — SODIUM CHLORIDE 0.9 % IV SOLN
INTRAVENOUS | Status: DC
  Administered 2012-03-02: 05:00:00 via INTRAVENOUS

## 2012-03-02 MED ORDER — FOLIC ACID 1 MG PO TABS
2.0000 mg | ORAL_TABLET | Freq: Every day | ORAL | Status: DC
  Administered 2012-03-02 – 2012-03-05 (×4): 2 mg via ORAL
  Filled 2012-03-02 (×4): qty 2

## 2012-03-02 MED ORDER — ADULT MULTIVITAMIN W/MINERALS CH
1.0000 | ORAL_TABLET | Freq: Every day | ORAL | Status: DC
  Administered 2012-03-02 – 2012-03-05 (×4): 1 via ORAL
  Filled 2012-03-02 (×4): qty 1

## 2012-03-02 MED ORDER — DEXTROSE 5 % IV SOLN
1.0000 g | Freq: Once | INTRAVENOUS | Status: AC
  Administered 2012-03-02: 1 g via INTRAVENOUS
  Filled 2012-03-02: qty 10

## 2012-03-02 MED ORDER — GUAIFENESIN ER 600 MG PO TB12
600.0000 mg | ORAL_TABLET | Freq: Two times a day (BID) | ORAL | Status: DC
  Administered 2012-03-02 – 2012-03-05 (×7): 600 mg via ORAL
  Filled 2012-03-02 (×8): qty 1

## 2012-03-02 NOTE — ED Notes (Signed)
Returned from CT.

## 2012-03-02 NOTE — H&P (Signed)
Hospital Admission Note Date: 03/02/2012  Patient name: Gina [name]           Medical record number: [id number] Date of birth: 1925-05-02           Age: 78 y.o.   Gender: female    PCP:   Elby Showers, MD, MD   Chief Complaint:  Pleuritic chest pain  HPI: Gina [name] is a 78 y.o. female was past medical history of atrial fibrillation, sick sinus syndrome status post PPM and coronary artery disease. Patient given to the hospital complaining about chest pain. Patient was in her usual state of health until last night about 8 PM when she developed right-sided chest pain. The pain started about 9/10, sharp, continuous, does not radiate and she noticed that the pain gets worse when she takes deep breath. Patient has some chills. She denies shortness of breath, palpitations, fever, excessive sputum production. Upon initial evaluation in the emergency department patient had chest x-ray showed bibasilar atelectasis versus pneumonia, so she was admitted for further evaluation.  Past Medical History: Past Medical History  Diagnosis Date  . Coronary artery disease   . Thyroid disease   . Hypercholesteremia   . Gout   . Hearing impaired   . Pulmonary hypertension   . Chronic lung disease   . Atrial fibrillation   . Sick sinus syndrome    Past Surgical History  Procedure Date  . Cardiac surgery   . Coronary artery bypass graft   . Pacemaker insertion   . Revision total hip arthroplasty   . Abdominal hysterectomy   . Cardiac electrophysiology study and ablation     Medications: Prior to Admission medications   Medication Sig Start Date End Date Taking? Authorizing Provider  amiodarone (PACERONE) 200 MG tablet Take 200 mg by mouth daily.   Yes Historical Provider, MD  aspirin EC 81 MG tablet Take 81 mg by mouth daily.   Yes Historical Provider, MD  cholecalciferol (VITAMIN D) 1000 UNITS tablet Take 2,000 Units by mouth daily.   Yes Historical Provider, MD  colchicine 0.6 MG  tablet Take 0.6 mg by mouth daily.   Yes Historical Provider, MD  folic acid (FOLVITE) 1 MG tablet Take 2 mg by mouth daily.   Yes Historical Provider, MD  furosemide (LASIX) 40 MG tablet Take 40 mg by mouth daily.   Yes Historical Provider, MD  iron polysaccharides (NIFEREX) 150 MG capsule Take 150 mg by mouth 2 (two) times daily.   Yes Historical Provider, MD  levothyroxine (SYNTHROID, LEVOTHROID) 100 MCG tablet Take 100 mcg by mouth daily.   Yes Historical Provider, MD  magnesium oxide (MAG-OX) 400 MG tablet Take 400 mg by mouth daily.   Yes Historical Provider, MD  Multiple Vitamin (MULITIVITAMIN WITH MINERALS) TABS Take 1 tablet by mouth daily.   Yes Historical Provider, MD  nitroGLYCERIN (NITROSTAT) 0.4 MG SL tablet Place 0.4 mg under the tongue every 5 (five) minutes as needed. For chest pain   Yes Historical Provider, MD  polyethylene glycol (MIRALAX / GLYCOLAX) packet Take 17 g by mouth daily as needed.   Yes Historical Provider, MD  potassium chloride SA (K-DUR,KLOR-CON) 20 MEQ tablet Take 20 mEq by mouth daily.   Yes Historical Provider, MD  rosuvastatin (CRESTOR) 10 MG tablet Take 10 mg by mouth daily.   Yes Historical Provider, MD  warfarin (COUMADIN) 5 MG tablet Take 2.5-5 mg by mouth daily. 1/2 tablet every day, mondays and thursdays taking a whole tablet  Yes Historical Provider, MD    Allergies:  No Known Allergies  Social History:  reports that she has never smoked. She does not have any smokeless tobacco history on file. She reports that she does not drink alcohol. Her drug history not on file.  Family History: No family history on file.  Review of Systems:  Constitutional: negative for anorexia, fevers and sweats Eyes: negative for irritation, redness and visual disturbance Ears, nose, mouth, throat, and face: negative for earaches, epistaxis, nasal congestion and sore throat Respiratory: negative for cough, dyspnea on exertion, sputum and wheezing Cardiovascular:  negative for chest pain, dyspnea, lower extremity edema, orthopnea, palpitations and syncope Gastrointestinal: negative for abdominal pain, constipation, diarrhea, melena, nausea and vomiting Genitourinary:negative for dysuria, frequency and hematuria Hematologic/lymphatic: negative for bleeding, easy bruising and lymphadenopathy Musculoskeletal:negative for arthralgias, muscle weakness and stiff joints Neurological: negative for coordination problems, gait problems, headaches and weakness Endocrine: negative for diabetic symptoms including polydipsia, polyuria and weight loss Allergic/Immunologic: negative for anaphylaxis, hay fever and urticaria  Physical Exam: BP 133/73  Pulse 74  Temp(Src) 98 F (36.7 C) (Oral)  Resp 21  SpO2 98% General appearance: alert, cooperative and no distress  Head: Normocephalic, without obvious abnormality, atraumatic  Eyes: conjunctivae/corneas clear. PERRL, EOM's intact. Fundi benign.  Nose: Nares normal. Septum midline. Mucosa normal. No drainage or sinus tenderness.  Throat: lips, mucosa, and tongue normal; teeth and gums normal  Neck: Supple, no masses, no cervical lymphadenopathy, no JVD appreciated, no meningeal signs Resp: clear to auscultation bilaterally  Chest wall: no tenderness  Cardio: regular rate and rhythm, S1, S2 normal, no murmur, click, rub or gallop  GI: soft, non-tender; bowel sounds normal; no masses, no organomegaly  Extremities: extremities normal, atraumatic, no cyanosis or edema  Skin: Skin color, texture, turgor normal. No rashes or lesions  Neurologic: Alert and oriented X 3, normal strength and tone. Normal symmetric reflexes. Normal coordination and gait  Labs on Admission:   Basename 03/02/12 0436  NA 139  K 4.0  CL 99  CO2 --  GLUCOSE 124*  BUN 25*  CREATININE 1.20*  CALCIUM --  MG --  PHOS --   No results found for this basename: AST:2,ALT:2,ALKPHOS:2,BILITOT:2,PROT:2,ALBUMIN:2 in the last 72 hours No  results found for this basename: LIPASE:2,AMYLASE:2 in the last 72 hours  Basename 03/02/12 0436 03/02/12 0425  WBC -- 11.5*  NEUTROABS -- 9.1*  HGB 14.6 13.8  HCT 43.0 40.3  MCV -- 86.1  PLT -- 208   No results found for this basename: CKTOTAL:3,CKMB:3,CKMBINDEX:3,TROPONINI:3 in the last 72 hours No results found for this basename: TSH,T4TOTAL,FREET3,T3FREE,THYROIDAB in the last 72 hours No results found for this basename: VITAMINB12:2,FOLATE:2,FERRITIN:2,TIBC:2,IRON:2,RETICCTPCT:2 in the last 72 hours  Radiological Exams on Admission: Dg Chest Portable 1 View  03/02/2012  *RADIOLOGY REPORT*  Clinical Data: Right-sided chest pain and shortness of breath.  PORTABLE CHEST - 1 VIEW  Comparison: Chest radiograph performed 01/13/2011  Findings: There is persistent elevation of the left hemidiaphragm. Mild bibasilar opacities may reflect atelectasis or pneumonia.  No definite pleural effusion or pneumothorax is identified.  The cardiomediastinal silhouette is borderline enlarged; the patient is status post median sternotomy.  A pacemaker is seen overlying the left chest wall, with leads ending overlying the right atrium and right ventricle.  No acute osseous abnormalities are seen.  IMPRESSION: Mild bibasilar airspace opacities may reflect atelectasis or pneumonia; borderline cardiomegaly noted.  Original Report Authenticated By: Tonia Ghent, M.D.     IMPRESSION: Present on Admission:  .  PNA (pneumonia) .Pleuritic chest pain .Atrial fibrillation .Sick sinus syndrome .Thyroid disease  Assessment/Plan  Pneumonia As mentioned above there is mild bibasilar airspace opacities may reflect atelectasis or pneumonia, patient already started on antibiotics. We'll continue azithromycin and Rocephin. Patient will be on bronchodilators, mucolytics and supplemental oxygen. I will repeat the chest x-ray in the morning.  Pleuritic chest pain When patient came in her chest pain sounds very atypical. She  had first set of cardiac enzymes negative. She had paced rhythm so EKG is not that helpful. I will cycle 3 sets of cardiac enzymes to rule out acute coronary syndrome. She is on Coumadin for atrial fibrillation, patient reported that she was recently off Coumadin for about a week before spinal steroids shot. I will check d-dimer to rule out acute PE.  Atrial fibrillation Rate controlled with amiodarone, patient has permanent pacemaker for sick sinus syndrome. Will get 12-lead EKG.  Hypothyroidism  Continue home supplementations, and check TSH.  Izea Livolsi A 03/02/2012, 8:21 AM

## 2012-03-02 NOTE — ED Provider Notes (Addendum)
History     CSN: [id number]  Arrival date & time 03/02/12  0414   First MD Initiated Contact with Patient 03/02/12 [phone]      Chief Complaint  Patient presents with  . Chest Pain    (Consider location/radiation/quality/duration/timing/severity/associated sxs/prior treatment) Patient is a 78 y.o. female presenting with chest pain. The history is provided by the patient and a relative.  Chest Pain The chest pain began 6 - 12 hours ago. Chest pain occurs constantly. The chest pain is unchanged. At its most intense, the pain is at 7/10. The pain is currently at 7/10. The severity of the pain is moderate. The quality of the pain is described as dull. The pain does not radiate. Pertinent negatives for primary symptoms include no fever, no fatigue, no syncope, no shortness of breath, no cough, no wheezing, no palpitations, no abdominal pain, no nausea, no vomiting and no dizziness. She tried nothing for the symptoms.    patient with known coronary artery disease has a pacemaker. Also has a medial sternotomy most likely had a CABG. Followed by [facility] heart and vascular cardiology. Followed by Dr. Clent Ridges from [facility] his primary care. Patient somewhat vague on the complaint but states that she's had right-sided chest pain has been constant since the evening somewhere approximately around 8:00. With daughter came home she was still complaining of this so she was brought into the emergency department. Patient states she tried nitroglycerin at home without relief but did not take any aspirin. Patient will be given aspirin in ED.  Past Medical History  Diagnosis Date  . Coronary artery disease   . Thyroid disease   . Hypercholesteremia   . Gout   . Hearing impaired   . Pulmonary hypertension   . Chronic lung disease   . Atrial fibrillation   . Sick sinus syndrome     Past Surgical History  Procedure Date  . Cardiac surgery   . Coronary artery bypass graft   . Pacemaker insertion    . Revision total hip arthroplasty   . Abdominal hysterectomy   . Cardiac electrophysiology study and ablation     No family history on file.  History  Substance Use Topics  . Smoking status: Never Smoker   . Smokeless tobacco: Not on file  . Alcohol Use: No    OB History    Grav Para Term Preterm Abortions TAB SAB Ect Mult Living                  Review of Systems  Constitutional: Negative for fever and fatigue.  HENT: Negative for congestion and neck pain.   Eyes: Negative for redness.  Respiratory: Negative for cough, shortness of breath and wheezing.   Cardiovascular: Positive for chest pain. Negative for palpitations and syncope.  Gastrointestinal: Negative for nausea, vomiting and abdominal pain.  Genitourinary: Negative for dysuria.  Musculoskeletal: Negative for back pain.  Skin: Negative for rash.  Neurological: Negative for dizziness.  Hematological: Does not bruise/bleed easily.    Allergies  Review of patient's allergies indicates no known allergies.  Home Medications   Current Outpatient Rx  Name Route Sig Dispense Refill  . AMIODARONE HCL 200 MG PO TABS Oral Take 200 mg by mouth daily.    . ASPIRIN EC 81 MG PO TBEC Oral Take 81 mg by mouth daily.    Marland Kitchen VITAMIN D 1000 UNITS PO TABS Oral Take 2,000 Units by mouth daily.    . COLCHICINE 0.6 MG PO TABS  Oral Take 0.6 mg by mouth daily.    Marland Kitchen FOLIC ACID 1 MG PO TABS Oral Take 2 mg by mouth daily.    . FUROSEMIDE 40 MG PO TABS Oral Take 40 mg by mouth daily.    Marland Kitchen POLYSACCHARIDE IRON COMPLEX 150 MG PO CAPS Oral Take 150 mg by mouth 2 (two) times daily.    Marland Kitchen LEVOTHYROXINE SODIUM 100 MCG PO TABS Oral Take 100 mcg by mouth daily.    Marland Kitchen MAGNESIUM OXIDE 400 MG PO TABS Oral Take 400 mg by mouth daily.    . ADULT MULTIVITAMIN W/MINERALS CH Oral Take 1 tablet by mouth daily.    Marland Kitchen NITROGLYCERIN 0.4 MG SL SUBL Sublingual Place 0.4 mg under the tongue every 5 (five) minutes as needed. For chest pain    . POLYETHYLENE  GLYCOL 3350 PO PACK Oral Take 17 g by mouth daily as needed.    Marland Kitchen POTASSIUM CHLORIDE CRYS ER 20 MEQ PO TBCR Oral Take 20 mEq by mouth daily.    Marland Kitchen ROSUVASTATIN CALCIUM 10 MG PO TABS Oral Take 10 mg by mouth daily.    . WARFARIN SODIUM 5 MG PO TABS Oral Take 2.5-5 mg by mouth daily. 1/2 tablet every day, mondays and thursdays taking a whole tablet      BP 133/73  Pulse 74  Temp(Src) 98 F (36.7 C) (Oral)  Resp 21  SpO2 98%  Physical Exam  Nursing note and vitals reviewed. Constitutional: She is oriented to person, place, and time. She appears well-developed and well-nourished. No distress.  HENT:  Head: Normocephalic and atraumatic.  Mouth/Throat: Oropharynx is clear and moist.  Eyes: Conjunctivae and EOM are normal. Pupils are equal, round, and reactive to light.  Neck: Normal range of motion. Neck supple.  Cardiovascular: Normal rate, regular rhythm and normal heart sounds.   No murmur heard. Pulmonary/Chest: Effort normal and breath sounds normal. No respiratory distress. She has no wheezes.  Abdominal: Soft. Bowel sounds are normal. There is no tenderness.  Musculoskeletal: Normal range of motion. She exhibits no edema and no tenderness.  Neurological: She is alert and oriented to person, place, and time. No cranial nerve deficit. She exhibits normal muscle tone. Coordination normal.  Skin: Skin is warm. No rash noted.    ED Course  Procedures (including critical care time)  Labs Reviewed  CBC - Abnormal; Notable for the following:    WBC 11.5 (*)    RDW 16.3 (*)    All other components within normal limits  DIFFERENTIAL - Abnormal; Notable for the following:    Neutrophils Relative 79 (*)    Neutro Abs 9.1 (*)    Lymphocytes Relative 10 (*)    Monocytes Absolute 1.2 (*)    All other components within normal limits  POCT I-STAT, CHEM 8 - Abnormal; Notable for the following:    BUN 25 (*)    Creatinine, Ser 1.20 (*)    Glucose, Bld 124 (*)    Calcium, Ion 1.11 (*)     All other components within normal limits  PRO B NATRIURETIC PEPTIDE  POCT I-STAT TROPONIN I   Dg Chest Portable 1 View  03/02/2012  *RADIOLOGY REPORT*  Clinical Data: Right-sided chest pain and shortness of breath.  PORTABLE CHEST - 1 VIEW  Comparison: Chest radiograph performed 01/13/2011  Findings: There is persistent elevation of the left hemidiaphragm. Mild bibasilar opacities may reflect atelectasis or pneumonia.  No definite pleural effusion or pneumothorax is identified.  The cardiomediastinal silhouette is borderline  enlarged; the patient is status post median sternotomy.  A pacemaker is seen overlying the left chest wall, with leads ending overlying the right atrium and right ventricle.  No acute osseous abnormalities are seen.  IMPRESSION: Mild bibasilar airspace opacities may reflect atelectasis or pneumonia; borderline cardiomegaly noted.  Original Report Authenticated By: Tonia Ghent, M.D.   Results for orders placed during the hospital encounter of 03/02/12  CBC      Component Value Range   WBC 11.5 (*) 4.0 - 10.5 (K/uL)   RBC 4.68  3.87 - 5.11 (MIL/uL)   Hemoglobin 13.8  12.0 - 15.0 (g/dL)   HCT 47.8  29.5 - 62.1 (%)   MCV 86.1  78.0 - 100.0 (fL)   MCH 29.5  26.0 - 34.0 (pg)   MCHC 34.2  30.0 - 36.0 (g/dL)   RDW 30.8 (*) 65.7 - 15.5 (%)   Platelets 208  150 - 400 (K/uL)  DIFFERENTIAL      Component Value Range   Neutrophils Relative 79 (*) 43 - 77 (%)   Neutro Abs 9.1 (*) 1.7 - 7.7 (K/uL)   Lymphocytes Relative 10 (*) 12 - 46 (%)   Lymphs Abs 1.2  0.7 - 4.0 (K/uL)   Monocytes Relative 11  3 - 12 (%)   Monocytes Absolute 1.2 (*) 0.1 - 1.0 (K/uL)   Eosinophils Relative 0  0 - 5 (%)   Eosinophils Absolute 0.0  0.0 - 0.7 (K/uL)   Basophils Relative 0  0 - 1 (%)   Basophils Absolute 0.0  0.0 - 0.1 (K/uL)  PRO B NATRIURETIC PEPTIDE      Component Value Range   Pro B Natriuretic peptide (BNP) 338.3  0 - 450 (pg/mL)  POCT I-STAT, CHEM 8      Component Value Range   Sodium  139  135 - 145 (mEq/L)   Potassium 4.0  3.5 - 5.1 (mEq/L)   Chloride 99  96 - 112 (mEq/L)   BUN 25 (*) 6 - 23 (mg/dL)   Creatinine, Ser 8.46 (*) 0.50 - 1.10 (mg/dL)   Glucose, Bld 962 (*) 70 - 99 (mg/dL)   Calcium, Ion 9.52 (*) 1.12 - 1.32 (mmol/L)   TCO2 34  0 - 100 (mmol/L)   Hemoglobin 14.6  12.0 - 15.0 (g/dL)   HCT 84.1  32.4 - 40.1 (%)  POCT I-STAT TROPONIN I      Component Value Range   Troponin i, poc 0.02  0.00 - 0.08 (ng/mL)   Comment 3             Date: 03/02/2012  Rate: 75  Rhythm: Atrial paced rhythm with prolonged AV conduction  QRS Axis: normal  Intervals: normal  ST/T Wave abnormalities: nonspecific ST/T changes  Conduction Disutrbances:none  Narrative Interpretation:   Old EKG Reviewed: unchanged No change in EKG compared to 01/14/2011   1. CAP (community acquired pneumonia)       MDM   Patient presented with right-sided chest pain workup the with the finding of probable community-acquired pneumonia patient has not been in the hospital for greater than 90 days. This may explain the right-sided chest pain may also explain mild elevation in the white blood cell count. Will treat patient with Rocephin IV and by mouth Zithromax she is followed by [facility] will consult [facility] for admission to ensure she gets better. Upon arrival here patient had an oxygen requirement her room air sats were 88%. Patient has a pacemaker EKG shows a paced rhythm  without any significant acute changes and her initial troponin which was probably 6 hours after the onset of the right-sided chest pain was negative. Based on her baseline her creatinine and BUN are worse consistent with some dehydration.  We'll discuss with triad for admission.       Shelda Jakes, MD 03/02/12 [id number]  Shelda Jakes, MD 03/03/12 2044

## 2012-03-02 NOTE — ED Notes (Addendum)
Pt here for CP, onset tonight, difficult and slow getting info from pt, pt dropped off by family, unable to obtain hx, taken straight back to exam room for EKG and triage in the back, CP onset tonight. Pt alert, NAD, calm, soft spoken, slow to answer, pale, cool.

## 2012-03-02 NOTE — Consult Note (Signed)
ANTICOAGULATION CONSULT NOTE - Initial Consult  Pharmacy Consult for Coumadin Indication: atrial fibrillation  No Known Allergies  Patient Measurements: Height: 5\' 1"  (154.9 cm) Weight: 146 lb 6.2 oz (66.4 kg) IBW/kg (Calculated) : 47.8   Vital Signs: Temp: 97.9 F (36.6 C) (05/04 0900) Temp src: Oral (05/04 0900) BP: 167/90 mmHg (05/04 0900) Pulse Rate: 74  (05/04 0900)  Labs:  Basename 03/02/12 1030 03/02/12 0949 03/02/12 0436 03/02/12 0425  HGB -- -- 14.6 13.8  HCT -- -- 43.0 40.3  PLT -- -- -- 208  APTT -- -- -- --  LABPROT 16.1* -- -- --  INR 1.26 -- -- --  HEPARINUNFRC -- -- -- --  CREATININE -- -- 1.20* --  CKTOTAL -- 32 -- --  CKMB -- 2.1 -- --  TROPONINI -- <0.30 -- --   Estimated Creatinine Clearance: 30.4 ml/min (by C-G formula based on Cr of 1.2).  Medical History: Past Medical History  Diagnosis Date  . Coronary artery disease   . Thyroid disease   . Hypercholesteremia   . Gout   . Hearing impaired   . Pulmonary hypertension   . Chronic lung disease   . Atrial fibrillation   . Sick sinus syndrome    Medications:  Prescriptions prior to admission  Medication Sig Dispense Refill  . amiodarone (PACERONE) 200 MG tablet Take 200 mg by mouth daily.      Marland Kitchen aspirin EC 81 MG tablet Take 81 mg by mouth daily.      . cholecalciferol (VITAMIN D) 1000 UNITS tablet Take 2,000 Units by mouth daily.      . colchicine 0.6 MG tablet Take 0.6 mg by mouth daily.      . folic acid (FOLVITE) 1 MG tablet Take 2 mg by mouth daily.      . furosemide (LASIX) 40 MG tablet Take 40 mg by mouth daily.      . iron polysaccharides (NIFEREX) 150 MG capsule Take 150 mg by mouth 2 (two) times daily.      Marland Kitchen levothyroxine (SYNTHROID, LEVOTHROID) 100 MCG tablet Take 100 mcg by mouth daily.      . magnesium oxide (MAG-OX) 400 MG tablet Take 400 mg by mouth daily.      . Multiple Vitamin (MULITIVITAMIN WITH MINERALS) TABS Take 1 tablet by mouth daily.      . nitroGLYCERIN  (NITROSTAT) 0.4 MG SL tablet Place 0.4 mg under the tongue every 5 (five) minutes as needed. For chest pain      . polyethylene glycol (MIRALAX / GLYCOLAX) packet Take 17 g by mouth daily as needed.      . potassium chloride SA (K-DUR,KLOR-CON) 20 MEQ tablet Take 20 mEq by mouth daily.      . rosuvastatin (CRESTOR) 10 MG tablet Take 10 mg by mouth daily.      Marland Kitchen warfarin (COUMADIN) 5 MG tablet Take 2.5-5 mg by mouth daily. 1/2 tablet every day, mondays and thursdays taking a whole tablet       Assessment: 86yof admitted with CP. On coumadin (and amiodarone) pta. Home dose is 2.5mg  daily except 5mg  on Mon/Thu. INR on admission is subtherapeutic. Patient does report that she was off coumadin for about a week before spinal steroid shots and resumed on 5/2.  D-dimer is elevated.  Goal of Therapy:  INR 2-3   Plan:  1) Coumadin 5mg  x 1 2) Daily INR  Fredrik Rigger 03/02/2012,12:03 PM

## 2012-03-03 ENCOUNTER — Inpatient Hospital Stay (HOSPITAL_COMMUNITY): Payer: Medicare Other

## 2012-03-03 DIAGNOSIS — J159 Unspecified bacterial pneumonia: Secondary | ICD-10-CM

## 2012-03-03 DIAGNOSIS — I4891 Unspecified atrial fibrillation: Secondary | ICD-10-CM

## 2012-03-03 DIAGNOSIS — I2699 Other pulmonary embolism without acute cor pulmonale: Secondary | ICD-10-CM

## 2012-03-03 DIAGNOSIS — I10 Essential (primary) hypertension: Secondary | ICD-10-CM

## 2012-03-03 LAB — URINE CULTURE

## 2012-03-03 LAB — CARDIAC PANEL(CRET KIN+CKTOT+MB+TROPI)
CK, MB: 1.9 ng/mL (ref 0.3–4.0)
Relative Index: INVALID (ref 0.0–2.5)
Total CK: 29 U/L (ref 7–177)
Troponin I: <0.3 ng/mL (ref <0.30)

## 2012-03-03 LAB — COMPREHENSIVE METABOLIC PANEL
Alkaline Phosphatase: 41 U/L (ref 39–117)
BUN: 16 mg/dL (ref 6–23)
Calcium: 8.5 mg/dL (ref 8.4–10.5)
GFR calc Af Amer: 76 mL/min — ABNORMAL LOW (ref >90)
Glucose, Bld: 128 mg/dL — ABNORMAL HIGH (ref 70–99)
Total Protein: 6 g/dL (ref 6.0–8.3)

## 2012-03-03 LAB — CBC
HCT: 37 % (ref 36.0–46.0)
Hemoglobin: 12.2 g/dL (ref 12.0–15.0)
RDW: 16.2 % — ABNORMAL HIGH (ref 11.5–15.5)
WBC: 11.3 10*3/uL — ABNORMAL HIGH (ref 4.0–10.5)

## 2012-03-03 LAB — PROTIME-INR: INR: 1.39 (ref 0.00–1.49)

## 2012-03-03 MED ORDER — WARFARIN SODIUM 5 MG PO TABS
5.0000 mg | ORAL_TABLET | Freq: Once | ORAL | Status: AC
  Administered 2012-03-03: 5 mg via ORAL
  Filled 2012-03-03: qty 1

## 2012-03-03 MED ORDER — ENOXAPARIN SODIUM 80 MG/0.8ML ~~LOC~~ SOLN
65.0000 mg | Freq: Two times a day (BID) | SUBCUTANEOUS | Status: DC
  Filled 2012-03-03 (×2): qty 0.8

## 2012-03-03 MED ORDER — MORPHINE SULFATE 2 MG/ML IJ SOLN
2.0000 mg | Freq: Every evening | INTRAMUSCULAR | Status: DC | PRN
  Administered 2012-03-03: 2 mg via INTRAVENOUS
  Filled 2012-03-03: qty 1

## 2012-03-03 MED ORDER — ENOXAPARIN SODIUM 80 MG/0.8ML ~~LOC~~ SOLN
65.0000 mg | Freq: Two times a day (BID) | SUBCUTANEOUS | Status: DC
  Administered 2012-03-03 – 2012-03-05 (×4): 65 mg via SUBCUTANEOUS
  Filled 2012-03-03 (×5): qty 0.8

## 2012-03-03 MED ORDER — IOHEXOL 350 MG/ML SOLN
100.0000 mL | Freq: Once | INTRAVENOUS | Status: AC | PRN
  Administered 2012-03-03: 100 mL via INTRAVENOUS

## 2012-03-03 MED ORDER — ENOXAPARIN SODIUM 30 MG/0.3ML ~~LOC~~ SOLN
30.0000 mg | Freq: Once | SUBCUTANEOUS | Status: AC
  Administered 2012-03-03: 30 mg via SUBCUTANEOUS
  Filled 2012-03-03: qty 0.3

## 2012-03-03 MED ORDER — ENOXAPARIN SODIUM 30 MG/0.3ML ~~LOC~~ SOLN: 1.0000 mg/kg | Freq: Two times a day (BID) | SUBCUTANEOUS | Status: DC

## 2012-03-03 NOTE — Progress Notes (Addendum)
ANTICOAGULATION CONSULT NOTE - Follow Up Consult  Pharmacy Consult for Coumadin Indication: atrial fibrillation  No Known Allergies  Vital Signs: Temp: 98.6 F (37 C) (05/05 0529) Temp src: Oral (05/05 0529) BP: 136/71 mmHg (05/05 0529) Pulse Rate: 73  (05/05 0529)  Labs:  Basename 03/03/12 0108 03/02/12 1755 03/02/12 1030 03/02/12 0949 03/02/12 0436 03/02/12 0425  HGB 12.2 -- -- -- 14.6 --  HCT 37.0 -- -- -- 43.0 40.3  PLT 184 -- -- -- -- 208  APTT -- -- -- -- -- --  LABPROT 17.3* -- 16.1* -- -- --  INR 1.39 -- 1.26 -- -- --  HEPARINUNFRC -- -- -- -- -- --  CREATININE 0.80 -- -- -- 1.20* --  CKTOTAL 29 32 -- 32 -- --  CKMB 1.9 2.0 -- 2.1 -- --  TROPONINI <0.30 <0.30 -- <0.30 -- --   Estimated Creatinine Clearance: 45.6 ml/min (by C-G formula based on Cr of 0.8).  Assessment: 86yof continues on coumadin. INR still subtherapeutic but trending up. No bleeding per chart notes. CBC stable. Continues on chronic amiodarone.   Goal of Therapy:  INR 2-3   Plan:  1) Coumadin 5mg  x 1 2) Follow up INR in AM 3) Follow up CT angio to r/o PE  Fredrik Rigger 03/03/2012,10:44 AM    CT angio reveals right lower lobe segmental PE with small clot burden. Patient already on coumadin but INR subtherapeutic. She will now begin therapeutic lovenox. Wt = 66.4kg and CrCl ~83ml/min. She will need a minimum 5 day overlap with coumadin and lovenox.  Plan: 1) Lovenox 65mg  sq q12 2) CBC q3days while on lovenox

## 2012-03-03 NOTE — Progress Notes (Signed)
DAILY PROGRESS NOTE                              GENERAL INTERNAL MEDICINE [facility]  SUBJECTIVE: Denies any shortness of breath, she still has pleuritic chest pain with deep breath  OBJECTIVE: BP 136/71  Pulse 73  Temp(Src) 98.6 F (37 C) (Oral)  Resp 18  Ht 5\' 1"  (1.549 m)  Wt 66.4 kg (146 lb 6.2 oz)  BMI 27.66 kg/m2  SpO2 94%  Intake/Output Summary (Last 24 hours) at 03/03/12 1046 Last data filed at 03/03/12 0749  Gross per 24 hour  Intake 2388.34 ml  Output    800 ml  Net 1588.34 ml                      Weight change:  Physical Exam: General: Alert and awake oriented x3 not in any acute distress. HEENT: anicteric sclera, pupils equal reactive to light and accommodation CVS: S1-S2 heard, no murmur rubs or gallops Chest: clear to auscultation bilaterally, no wheezing rales or rhonchi Abdomen:  normal bowel sounds, soft, nontender, nondistended, no organomegaly Neuro: Cranial nerves II-XII intact, no focal neurological deficits Extremities: no cyanosis, no clubbing or edema noted bilaterally   Lab Results:  Basename 03/03/12 0108 03/02/12 0436  NA 136 139  K 4.0 4.0  CL 99 99  CO2 27 --  GLUCOSE 128* 124*  BUN 16 25*  CREATININE 0.80 1.20*  CALCIUM 8.5 --  MG -- --  PHOS -- --    Basename 03/03/12 0108  AST 16  ALT 33  ALKPHOS 41  BILITOT 0.6  PROT 6.0  ALBUMIN 2.8*   No results found for this basename: LIPASE:2,AMYLASE:2 in the last 72 hours  Basename 03/03/12 0108 03/02/12 0436 03/02/12 0425  WBC 11.3* -- 11.5*  NEUTROABS -- -- 9.1*  HGB 12.2 14.6 --  HCT 37.0 43.0 --  MCV 86.4 -- 86.1  PLT 184 -- 208    Basename 03/03/12 0108 03/02/12 1755 03/02/12 0949  CKTOTAL 29 32 32  CKMB 1.9 2.0 2.1  CKMBINDEX -- -- --  TROPONINI <0.30 <0.30 <0.30   No components found with this basename: POCBNP:3  Basename 03/02/12 0949  DDIMER 2.22*   No results found for this basename: HGBA1C:2 in the last 72 hours No results found for this  basename: CHOL:2,HDL:2,LDLCALC:2,TRIG:2,CHOLHDL:2,LDLDIRECT:2 in the last 72 hours  Basename 03/02/12 0949  TSH 1.672  T4TOTAL --  T3FREE --  THYROIDAB --   No results found for this basename: VITAMINB12:2,FOLATE:2,FERRITIN:2,TIBC:2,IRON:2,RETICCTPCT:2 in the last 72 hours  Micro Results: No results found for this or any previous visit (from the past 240 hour(s)).  Studies/Results: X-ray Chest Pa And Lateral   03/03/2012  *RADIOLOGY REPORT*  Clinical Data: Pneumonia shortness of breath  CHEST - 2 VIEW  Comparison: Mar 02, 2012  Findings: Cardiomegaly is unchanged.  The pulmonary vasculature is within normal limits.  The pacemaker leads are intact and stable in position.  Left lower lobe effusion and infiltrate versus atelectasis do not appear significantly changed.  IMPRESSION: No significant change in left basilar effusion and atelectasis versus infiltrate.  Original Report Authenticated By: Brandon Melnick, M.D.   Dg Chest Portable 1 View  03/02/2012  *RADIOLOGY REPORT*  Clinical Data: Right-sided chest pain and shortness of breath.  PORTABLE CHEST - 1 VIEW  Comparison: Chest radiograph performed 01/13/2011  Findings: There is persistent elevation of the left hemidiaphragm. Mild bibasilar  opacities may reflect atelectasis or pneumonia.  No definite pleural effusion or pneumothorax is identified.  The cardiomediastinal silhouette is borderline enlarged; the patient is status post median sternotomy.  A pacemaker is seen overlying the left chest wall, with leads ending overlying the right atrium and right ventricle.  No acute osseous abnormalities are seen.  IMPRESSION: Mild bibasilar airspace opacities may reflect atelectasis or pneumonia; borderline cardiomegaly noted.  Original Report Authenticated By: Tonia Ghent, M.D.   Medications: Scheduled Meds:   . sodium chloride   Intravenous STAT  . albuterol  2.5 mg Nebulization TID  . amiodarone  200 mg Oral Daily  . aspirin EC  81 mg Oral Daily    . atorvastatin  20 mg Oral q1800  . azithromycin  500 mg Intravenous Q24H  . cefTRIAXone (ROCEPHIN)  IV  1 g Intravenous Q24H  . cholecalciferol  2,000 Units Oral Daily  . colchicine  0.6 mg Oral Daily  . enoxaparin  40 mg Subcutaneous Q24H  . folic acid  2 mg Oral Daily  . guaiFENesin  600 mg Oral BID  . ipratropium  0.5 mg Nebulization TID  . iron polysaccharides  150 mg Oral BID  . levothyroxine  100 mcg Oral Q0600  . magnesium oxide  400 mg Oral Daily  . mulitivitamin with minerals  1 tablet Oral Daily  . potassium chloride SA  20 mEq Oral Daily  . sodium chloride  3 mL Intravenous Q12H  . warfarin  5 mg Oral ONCE-1800  . Warfarin - Pharmacist Dosing Inpatient   Does not apply q1800  . DISCONTD: albuterol  2.5 mg Nebulization Q6H  . DISCONTD: ipratropium  0.5 mg Nebulization Q6H   Continuous Infusions:   . sodium chloride 100 mL/hr at 03/02/12 1508   PRN Meds:.acetaminophen, acetaminophen, HYDROcodone-acetaminophen, nitroGLYCERIN, polyethylene glycol  ASSESSMENT & PLAN: Principal Problem:  *PNA (pneumonia) Active Problems:  Pleuritic chest pain  Thyroid disease  Atrial fibrillation  Sick sinus syndrome   Pneumonia -Mild bibasilar airspace opacities, treated as pneumonia. -Patient is on azithromycin Rocephin, continue bronchodilators, mucolytics and supplemental oxygen.  Pleuritic chest pain -Patient continues to have pleuritic chest pain, has positive d-dimer is yesterday of 2.2. -I will rule out acute PE by CT angiography today. Patient anyway on Coumadin, but has a subtherapeutic INR.  Atrial fibrillation -Rate controlled with amiodarone. -Patient has permanent pacemaker for sick sinus syndrome.  Hypothyroidism  -Continue with a home supplementation and check TSH.   LOS: 1 day   Wynne Jury A 03/03/2012, 10:46 AM

## 2012-03-03 NOTE — Progress Notes (Signed)
2100 Pt complained of mid right sided abdominal pain.  Patient said 1600 dose of Norco did not help with pain.  Contacted [place], NP who came and assessed patient and gave order for Morphine 2 mg IV HS.  Morphine administered. Will continue to monitor. Gina [name]

## 2012-03-03 NOTE — Progress Notes (Signed)
   Notified by radiology, Mrs. Gina [name] has right lower lobe segmental acute PE.  There is also right lower lobe pneumonia.  Although there is a small clot burden in patient on Coumadin but she has subtherapeutic INR I will start therapeutic Lovenox.  Clint Lipps Pager: [phone] 03/03/2012, 12:44 PM

## 2012-03-04 DIAGNOSIS — I2699 Other pulmonary embolism without acute cor pulmonale: Secondary | ICD-10-CM

## 2012-03-04 DIAGNOSIS — J159 Unspecified bacterial pneumonia: Secondary | ICD-10-CM

## 2012-03-04 DIAGNOSIS — I4891 Unspecified atrial fibrillation: Secondary | ICD-10-CM

## 2012-03-04 DIAGNOSIS — I10 Essential (primary) hypertension: Secondary | ICD-10-CM

## 2012-03-04 LAB — PROTIME-INR: Prothrombin Time: 19.1 seconds — ABNORMAL HIGH (ref 11.6–15.2)

## 2012-03-04 LAB — BASIC METABOLIC PANEL
Calcium: 8.2 mg/dL — ABNORMAL LOW (ref 8.4–10.5)
GFR calc non Af Amer: 80 mL/min — ABNORMAL LOW (ref >90)
Potassium: 4.1 mEq/L (ref 3.5–5.1)
Sodium: 138 mEq/L (ref 135–145)

## 2012-03-04 LAB — CBC
Platelets: 174 10*3/uL (ref 150–400)
RBC: 4.06 MIL/uL (ref 3.87–5.11)
WBC: 9.4 10*3/uL (ref 4.0–10.5)

## 2012-03-04 MED ORDER — WARFARIN SODIUM 5 MG PO TABS
5.0000 mg | ORAL_TABLET | Freq: Once | ORAL | Status: AC
  Administered 2012-03-04: 5 mg via ORAL
  Filled 2012-03-04: qty 1

## 2012-03-04 NOTE — Progress Notes (Signed)
DAILY PROGRESS NOTE                              [facility]  SUBJECTIVE: Less shortness of breath and less cough. Her chest pain is improving as well.  OBJECTIVE: BP 146/70  Pulse 73  Temp(Src) 98 F (36.7 C) (Oral)  Resp 18  Ht 5\' 1"  (1.549 m)  Wt 66.4 kg (146 lb 6.2 oz)  BMI 27.66 kg/m2  SpO2 95%  Intake/Output Summary (Last 24 hours) at 03/04/12 1055 Last data filed at 03/04/12 0900  Gross per 24 hour  Intake   2755 ml  Output   1170 ml  Net   1585 ml                      Weight change:  Physical Exam: General: Alert and awake oriented x3 not in any acute distress. HEENT: anicteric sclera, pupils equal reactive to light and accommodation CVS: S1-S2 heard, no murmur rubs or gallops Chest: clear to auscultation bilaterally, no wheezing rales or rhonchi Abdomen:  normal bowel sounds, soft, nontender, nondistended, no organomegaly Neuro: Cranial nerves II-XII intact, no focal neurological deficits Extremities: no cyanosis, no clubbing or edema noted bilaterally   Lab Results:  Basename 03/04/12 0539 03/03/12 0108  NA 138 136  K 4.1 4.0  CL 105 99  CO2 24 27  GLUCOSE 124* 128*  BUN 8 16  CREATININE 0.63 0.80  CALCIUM 8.2* 8.5  MG -- --  PHOS -- --    Basename 03/03/12 0108  AST 16  ALT 33  ALKPHOS 41  BILITOT 0.6  PROT 6.0  ALBUMIN 2.8*   No results found for this basename: LIPASE:2,AMYLASE:2 in the last 72 hours  Basename 03/04/12 0539 03/03/12 0108 03/02/12 0425  WBC 9.4 11.3* --  NEUTROABS -- -- 9.1*  HGB 11.4* 12.2 --  HCT 35.1* 37.0 --  MCV 86.5 86.4 --  PLT 174 184 --    Basename 03/03/12 0108 03/02/12 1755 03/02/12 0949  CKTOTAL 29 32 32  CKMB 1.9 2.0 2.1  CKMBINDEX -- -- --  TROPONINI <0.30 <0.30 <0.30   No components found with this basename: POCBNP:3  Basename 03/02/12 0949  DDIMER 2.22*   No results found for this basename: HGBA1C:2 in the last 72 hours No results found for this basename:  CHOL:2,HDL:2,LDLCALC:2,TRIG:2,CHOLHDL:2,LDLDIRECT:2 in the last 72 hours  Basename 03/02/12 0949  TSH 1.672  T4TOTAL --  T3FREE --  THYROIDAB --   No results found for this basename: VITAMINB12:2,FOLATE:2,FERRITIN:2,TIBC:2,IRON:2,RETICCTPCT:2 in the last 72 hours  Micro Results: Recent Results (from the past 240 hour(s))  URINE CULTURE     Status: Normal   Collection Time   03/02/12  1:25 PM      Component Value Range Status Comment   Specimen Description URINE, CLEAN CATCH   Final    Special Requests NONE   Final    Culture  Setup Time [id number]   Final    Colony Count >=100,000 COLONIES/ML   Final    Culture     Final    Value: DIPHTHEROIDS(CORYNEBACTERIUM SPECIES)     Note: Standardized susceptibility testing for this organism is not available.   Report Status 03/03/2012 FINAL   Final     Studies/Results: X-ray Chest Pa And Lateral   03/03/2012  *RADIOLOGY REPORT*  Clinical Data: Pneumonia shortness of breath  CHEST - 2 VIEW  Comparison: Mar 02, 2012  Findings: Cardiomegaly is unchanged.  The pulmonary vasculature is within normal limits.  The pacemaker leads are intact and stable in position.  Left lower lobe effusion and infiltrate versus atelectasis do not appear significantly changed.  IMPRESSION: No significant change in left basilar effusion and atelectasis versus infiltrate.  Original Report Authenticated By: Brandon Melnick, M.D.   Dg Chest Portable 1 View  03/02/2012  *RADIOLOGY REPORT*  Clinical Data: Right-sided chest pain and shortness of breath.  PORTABLE CHEST - 1 VIEW  Comparison: Chest radiograph performed 01/13/2011  Findings: There is persistent elevation of the left hemidiaphragm. Mild bibasilar opacities may reflect atelectasis or pneumonia.  No definite pleural effusion or pneumothorax is identified.  The cardiomediastinal silhouette is borderline enlarged; the patient is status post median sternotomy.  A pacemaker is seen overlying the left chest wall, with leads  ending overlying the right atrium and right ventricle.  No acute osseous abnormalities are seen.  IMPRESSION: Mild bibasilar airspace opacities may reflect atelectasis or pneumonia; borderline cardiomegaly noted.  Original Report Authenticated By: Tonia Ghent, M.D.   Medications: Scheduled Meds:    . albuterol  2.5 mg Nebulization TID  . amiodarone  200 mg Oral Daily  . aspirin EC  81 mg Oral Daily  . atorvastatin  20 mg Oral q1800  . azithromycin  500 mg Intravenous Q24H  . cefTRIAXone (ROCEPHIN)  IV  1 g Intravenous Q24H  . cholecalciferol  2,000 Units Oral Daily  . colchicine  0.6 mg Oral Daily  . enoxaparin (LOVENOX) injection  30 mg Subcutaneous Once  . enoxaparin (LOVENOX) injection  65 mg Subcutaneous Q12H  . folic acid  2 mg Oral Daily  . guaiFENesin  600 mg Oral BID  . ipratropium  0.5 mg Nebulization TID  . iron polysaccharides  150 mg Oral BID  . levothyroxine  100 mcg Oral Q0600  . magnesium oxide  400 mg Oral Daily  . mulitivitamin with minerals  1 tablet Oral Daily  . potassium chloride SA  20 mEq Oral Daily  . sodium chloride  3 mL Intravenous Q12H  . warfarin  5 mg Oral ONCE-1800  . Warfarin - Pharmacist Dosing Inpatient   Does not apply q1800  . DISCONTD: enoxaparin  1 mg/kg Subcutaneous Q12H  . DISCONTD: enoxaparin  40 mg Subcutaneous Q24H  . DISCONTD: enoxaparin (LOVENOX) injection  65 mg Subcutaneous Q12H   Continuous Infusions:    . sodium chloride 100 mL/hr at 03/03/12 1511   PRN Meds:.acetaminophen, acetaminophen, HYDROcodone-acetaminophen, iohexol, morphine injection, nitroGLYCERIN, polyethylene glycol  ASSESSMENT & PLAN: Principal Problem:  *PNA (pneumonia) Active Problems:  Pleuritic chest pain  Thyroid disease  Atrial fibrillation  Sick sinus syndrome   Pneumonia -Bibasilar pneumonia, community-acquired. -Patient is on azithromycin Rocephin, continue bronchodilators, mucolytics and supplemental oxygen.  Acute PE -Patient is on Coumadin  for chronic atrial fibrillation. That was held for that procedure. -When patient came in, she had INR of 1.2. -CT scan is positive for acute PE, patient started on therapeutic dose of Lovenox, till the INR reaches 2-3.  Liver lesions -Patient has high attenuation of the liver parenchyma likely secondary to amiodarone. -Patient also has indeterminate liver masses, or some for metastasis. -Review of CT scan in 2006 showed stable hemangiomas, likely her liver masses as hemangiomas. -She needs followup as outpatient with imaging like ultrasound or MRI of the liver.  Atrial fibrillation -Rate controlled with amiodarone. -Patient has permanent pacemaker for sick sinus syndrome.  Hypothyroidism  -Continue with a  home supplementation and check TSH.   LOS: 2 days   Florella Mcneese A 03/04/2012, 10:55 AM

## 2012-03-04 NOTE — Evaluation (Signed)
Occupational Therapy Evaluation Patient Details Name: Gina [name] MRN: [id number] DOB: 1925/09/07 Today's Date: 03/04/2012 Time: 1040-1105 OT Time Calculation (min): 25 min  OT Assessment / Plan / Recommendation Clinical Impression  Pt presents to OT with decreased I with ADL activity compared to pts baseline s/p admit to hospital. Pt will benefit from OT to increase I with ADL activity and return to PLOF    OT Assessment  Patient needs continued OT Services    Follow Up Recommendations  Home health OT    Equipment Recommendations  None recommended by OT    Frequency Min 2X/week    Precautions / Restrictions Precautions Precautions: Fall       ADL  Grooming: Performed;Brushing hair;Teeth care;Supervision/safety Where Assessed - Grooming: Standing at sink Upper Body Bathing: Simulated Where Assessed - Upper Body Bathing: Unsupported;Sitting, chair Lower Body Bathing: Simulated;Minimal assistance Where Assessed - Lower Body Bathing: Sit to stand from chair Upper Body Dressing: Simulated;Set up Where Assessed - Upper Body Dressing: Sitting, bed;Unsupported Lower Body Dressing: Minimal assistance Where Assessed - Lower Body Dressing: Sit to stand from chair Toilet Transfer: Performed;Supervision/safety Toilet Transfer Method: Proofreader: Regular height toilet;Grab bars Toileting - Clothing Manipulation: Performed;Supervision/safety Where Assessed - Glass blower/designer Manipulation: Standing Toileting - Hygiene: Supervision/safety Ambulation Related to ADLs: Encouraged deep breathing and rest breaks during ADL tasks.  Sats 88-91 after activity. Encouraged deep breathing in upright sitting position.    OT Goals Acute Rehab OT Goals OT Goal Formulation: With patient ADL Goals Pt Will Perform Grooming: with modified independence;Standing at sink ADL Goal: Grooming - Progress: Goal set today Pt Will Transfer to Toilet: with modified  independence;Comfort height toilet ADL Goal: Toilet Transfer - Progress: Goal set today Pt Will Perform Tub/Shower Transfer: with modified independence;Shower seat with back ADL Goal: Web designer - Progress: Goal set today  Visit Information  Assistance Needed: +1    Subjective Data  Subjective: I do all my work at home- cooking and housework, but my husband helps Patient Stated Goal: get home   Prior Functioning  Home Living Lives With: Spouse Available Help at Discharge: Family Type of Home: House Home Access: Stairs to enter Secretary/administrator of Steps: 4 Entrance Stairs-Rails: Right Home Layout: One level Bathroom Shower/Tub: Engineer, manufacturing systems: Handicapped height Home Adaptive Equipment: Walker - rolling;Straight cane;Shower chair with back Prior Function Level of Independence:  (uses cane) Able to Take Stairs?: Yes Driving: No Vocation: Retired Musician: HOH Dominant Hand: Right    Cognition  Overall Cognitive Status: Appears within functional limits for tasks assessed/performed Arousal/Alertness: Awake/alert Orientation Level: Appears intact for tasks assessed Behavior During Session: [facility] for tasks performed    Extremity/Trunk Assessment Right Upper Extremity Assessment RUE ROM/Strength/Tone: [facility] for tasks assessed Left Upper Extremity Assessment LUE ROM/Strength/Tone: WFL for tasks assessed Right Lower Extremity Assessment RLE ROM/Strength/Tone: Within functional levels Left Lower Extremity Assessment LLE ROM/Strength/Tone: Within functional levels   Mobility Bed Mobility Bed Mobility: Supine to Sit;Sitting - Scoot to Edge of Bed Supine to Sit: 6: Modified independent (Device/Increase time);HOB elevated (HOB 10 degrees) Sitting - Scoot to Edge of Bed: 6: Modified independent (Device/Increase time) Transfers Transfers: Sit to Stand Sit to Stand: 5: Supervision Stand to Sit: 5: Supervision Details for Transfer  Assistance: No cues needed      Balance Static Standing Balance Static Standing - Balance Support: No upper extremity supported Static Standing - Level of Assistance: 6: Modified independent (Device/Increase time) Dynamic Standing Balance Dynamic Standing - Balance  Support: Right upper extremity supported (with cane) Dynamic Standing - Level of Assistance: 5: Stand by assistance  End of Session OT - End of Session Activity Tolerance: Patient tolerated treatment well Patient left: in chair;with family/visitor present;with chair alarm set   Einar Crow D 03/04/2012, 11:26 AM

## 2012-03-04 NOTE — Progress Notes (Signed)
ANTICOAGULATION CONSULT NOTE - Follow Up Consult  Pharmacy Consult for Lovenox/Coumadin Indication: Afib and PE  No Known Allergies  Patient Measurements: Height: 5\' 1"  (154.9 cm) Weight: 146 lb 6.2 oz (66.4 kg) IBW/kg (Calculated) : 47.8  Heparin Dosing Weight:    Vital Signs: Temp: 98 F (36.7 C) (05/06 0454) Temp src: Oral (05/06 0454) BP: 146/70 mmHg (05/06 0454) Pulse Rate: 73  (05/06 1000)  Labs:  Basename 03/04/12 0539 03/03/12 0108 03/02/12 1755 03/02/12 1030 03/02/12 0949 03/02/12 0436 03/02/12 0425  HGB 11.4* 12.2 -- -- -- -- --  HCT 35.1* 37.0 -- -- -- 43.0 --  PLT 174 184 -- -- -- -- 208  APTT -- -- -- -- -- -- --  LABPROT 19.1* 17.3* -- 16.1* -- -- --  INR 1.57* 1.39 -- 1.26 -- -- --  HEPARINUNFRC -- -- -- -- -- -- --  CREATININE 0.63 0.80 -- -- -- 1.20* --  CKTOTAL -- 29 32 -- 32 -- --  CKMB -- 1.9 2.0 -- 2.1 -- --  TROPONINI -- <0.30 <0.30 -- <0.30 -- --   Estimated Creatinine Clearance: 45.6 ml/min (by C-G formula based on Cr of 0.63).  Assessment: 86yof admitted with CP. On coumadin (and amiodarone) pta for afib. (Home dose is 2.5mg  daily except 5mg  on Mon/Thu). INR on admission is subtherapeutic. Patient does report that she was off coumadin for about a week before spinal steroid shots and resumed on 5/2. Ct angio eveals RLL PE with small clot burden.  Anticoag: INR 1.57 for afib and new PE. Needs 5 days overlap of Lovenox/Coumadin, today is day #25  ID: CAP D#3 Rocephin/Azith, afebrile, WBC 9.4 down.  CV: hx HLD/CAD/pulm HTN/afib, VSS on amiodarone/asa81/lipitor, her CP is atypical, CEs negative  Endo: thyroid dz, gout - colchicine resumed. levothyroxine  GI/Nutrition: Vitamin D, FA, Iron,Magox, MV, K  Liver: liver lesion, masses. Need outpt. F/u.  Goal of Therapy:  INR 2-3   Plan:  Lovenox 65mg  sq q12h until INR>2  Coumadin 5mg  po x 1 again today.   Merilynn Finland, [employer] 03/04/2012,11:41 AM

## 2012-03-04 NOTE — Care Management Note (Addendum)
    Page 1 of 2   03/06/2012     11:32:38 AM   CARE MANAGEMENT NOTE 03/06/2012  Patient:  [name],[name]   Account Number:  [id number]  Date Initiated:  03/04/2012  Documentation initiated by:  Letha Cape  Subjective/Objective Assessment:   dx pna, p.e.  admit- lives with spouse. Patient  uses a cane at home.     Action/Plan:   pt/ot eval- rec hhpt/ot   Anticipated DC Date:  03/07/2012   Anticipated DC Plan:  HOME W HOME HEALTH SERVICES      DC Planning Services  CM consult  Follow-up appt scheduled      [facility] Choice  HOME HEALTH   Choice offered to / List presented to:  C-4 Adult Children        HH arranged  HH-2 PT  HH-3 OT      [facility] agency  [facility]   Status of service:  Completed, signed off Medicare Important Message given?   (If response is "NO", the following Medicare IM given date fields will be blank) Date Medicare IM given:   Date Additional Medicare IM given:    Discharge Disposition:  HOME W HOME HEALTH SERVICES  Per UR Regulation:    If discussed at Long Length of Stay Meetings, dates discussed:    Comments:  PCP Dr. Clent Ridges  03/06/12- 1100- Donn Pierini RN, BSN [phone] Call made to Dr. Claris Che office again this AM to try to schedule pt's PT/INR appointment- spoke with Larita Fife who put Pt on tomorrow's schedule 03/07/12 for PT/INR- pt can come anytime between 8:30-11:30 or 2-4:30 for blood work. called pt's daughter Gina [name] and gave her information on appoinment for tomorrow at Dr. Claris Che office.  03/05/12- 1100- Donn Pierini RN, BSN [phone] Spoke with pt at bedside regarding d/c needs. Pt agreeable to [facility] services. Order for HH-PT/OT- per pt and daughter- [facility] agency of choice is [facility]- referral made for [facility] services via [facility] and call made to Debbie with [facility]. Pt will also need Lovenox at discharge- benefits check done- pts co-pay will be $95- this info given to both pt and daughter- call made to pt's pharmacy- [facility] on [address] and per  conversation with pharmacist- they do have generic drug in stock 100 mg strength. Calls x2 have been placed to pt's PCP- Dr. Clent Ridges for PT/INR appointment- awaiting call back- for appointment time- have spoken to pt and daughter- will call them with appointment time if pt discharged  before call received from daughter- confirmed daughters phone numbers including cell- home- [phone] cell-[phone] Loney Loh)   03/04/12 15:37 Letha Cape RN, BSN [phone] patient lives with spouse, pta independent per daughter, Gina [name] .  Patient has a cane and a rolling walker at home. Physical therapy recs hhpt/ot.  Gina [name] states she would like for patient to work with [facility] for hhpt/ot.  Gina [name] states she takes care of patient's medications so she does not need a Charity fundraiser.  Will need orders for hhpt/ot.

## 2012-03-04 NOTE — Progress Notes (Signed)
03/04/2012 [name] SPARKS Case Management Note [phone]  Utilization review completed.  

## 2012-03-04 NOTE — Evaluation (Signed)
Physical Therapy Evaluation Patient Details Name: Gina [name] MRN: [id number] DOB: [birth year] Today's Date: 03/04/2012 Time: 4098-1191 PT Time Calculation (min): 35 min  PT Assessment / Plan / Recommendation Clinical Impression  Pt adm with PNA and found to have PE.  Pt's mobility primarily limited by hospital environment.  Feel pt will be at Mod Independent level at home.  Pt did desat (84% on RA) with amb.  Pt reports she has O2 at home already that she uses intermittently.  Recommend HHPT at dc.    PT Assessment  Patient needs continued PT services    Follow Up Recommendations  Home health PT;Supervision - Intermittent    Equipment Recommendations  None recommended by PT    Frequency Min 3X/week    Precautions / Restrictions Precautions Precautions: Fall         Mobility  Bed Mobility Bed Mobility: Supine to Sit;Sitting - Scoot to Edge of Bed Supine to Sit: 6: Modified independent (Device/Increase time);HOB elevated (HOB 10 degrees) Sitting - Scoot to Edge of Bed: 6: Modified independent (Device/Increase time) Transfers Transfers: Sit to Stand;Stand to Sit Sit to Stand: 6: Modified independent (Device/Increase time);From bed;From chair/3-in-1;With upper extremity assist;With armrests Stand to Sit: 6: Modified independent (Device/Increase time);With armrests;To chair/3-in-1;With upper extremity assist Details for Transfer Assistance: No cues needed Ambulation/Gait Ambulation/Gait Assistance: 5: Supervision Ambulation Distance (Feet): 150 Feet Assistive device: Straight cane Ambulation/Gait Assistance Details: No loss of balance. Gait Pattern: Wide base of support Gait velocity: slow pace    Exercises     PT Goals Acute Rehab PT Goals PT Goal Formulation: With patient Time For Goal Achievement: 03/04/12 Potential to Achieve Goals: Good Pt will Ambulate: 51 - 150 feet;with modified independence PT Goal: Ambulate - Progress: Goal set today Pt will Go Up /  Down Stairs: 1-2 stairs;with supervision;with least restrictive assistive device PT Goal: Up/Down Stairs - Progress: Goal set today  Visit Information  Last PT Received On: 03/04/12 Assistance Needed: +1    Subjective Data  Subjective: Pt states she has been married 65 years. Patient Stated Goal: Be able to check things out at home   Prior Functioning  Home Living Lives With: Spouse Available Help at Discharge: Family Type of Home: House Home Access: Stairs to enter [employer] of Steps: 4 Entrance Stairs-Rails: Right Home Layout: One level Bathroom Shower/Tub: Engineer, manufacturing systems: Handicapped height Home Adaptive Equipment: Walker - rolling;Straight cane;Shower chair with back Prior Function Level of Independence:  (uses cane) Able to Take Stairs?: Yes Driving: No Vocation: Retired Musician: HOH Dominant Hand: Right    Cognition  Overall Cognitive Status: Appears within functional limits for tasks assessed/performed Arousal/Alertness: Awake/alert Orientation Level: Appears intact for tasks assessed Behavior During Session: [facility] for tasks performed    Extremity/Trunk Assessment Right Lower Extremity Assessment RLE ROM/Strength/Tone: Within functional levels Left Lower Extremity Assessment LLE ROM/Strength/Tone: Within functional levels   Balance Static Standing Balance Static Standing - Balance Support: No upper extremity supported Static Standing - Level of Assistance: 6: Modified independent (Device/Increase time) Dynamic Standing Balance Dynamic Standing - Balance Support: Right upper extremity supported (with cane) Dynamic Standing - Level of Assistance: 5: Stand by assistance  End of Session PT - End of Session Equipment Utilized During Treatment: Gait belt Activity Tolerance: Patient tolerated treatment well Patient left: in chair;with chair alarm set Nurse Communication: Mobility status   Maansi Wike 03/04/2012,  10:25 AM  Skip Mayer PT [phone]

## 2012-03-05 DIAGNOSIS — I2699 Other pulmonary embolism without acute cor pulmonale: Secondary | ICD-10-CM

## 2012-03-05 DIAGNOSIS — I10 Essential (primary) hypertension: Secondary | ICD-10-CM

## 2012-03-05 DIAGNOSIS — J159 Unspecified bacterial pneumonia: Secondary | ICD-10-CM

## 2012-03-05 DIAGNOSIS — I4891 Unspecified atrial fibrillation: Secondary | ICD-10-CM

## 2012-03-05 LAB — PROTIME-INR
INR: 1.83 — ABNORMAL HIGH (ref 0.00–1.49)
Prothrombin Time: 21.5 seconds — ABNORMAL HIGH (ref 11.6–15.2)

## 2012-03-05 MED ORDER — ENOXAPARIN SODIUM 100 MG/ML ~~LOC~~ SOLN: 100.0000 mg | Freq: Every day | SUBCUTANEOUS | Status: DC

## 2012-03-05 MED ORDER — OXYCODONE-ACETAMINOPHEN 5-325 MG PO TABS: 1.0000 | ORAL_TABLET | ORAL | Status: AC | PRN

## 2012-03-05 MED ORDER — AMOXICILLIN-POT CLAVULANATE 500-125 MG PO TABS: 1.0000 | ORAL_TABLET | Freq: Two times a day (BID) | ORAL | Status: AC

## 2012-03-05 MED ORDER — WARFARIN SODIUM 5 MG PO TABS
5.0000 mg | ORAL_TABLET | Freq: Once | ORAL | Status: DC
  Filled 2012-03-05: qty 1

## 2012-03-05 NOTE — Discharge Instructions (Signed)
HH- PT/OT arranged with [facility]- [phone] ° ° ° °

## 2012-03-05 NOTE — Progress Notes (Signed)
Occupational Therapy Treatment Patient Details Name: Gina [name] MRN: [id number] DOB: 28-Jan-1925 Today's Date: 03/05/2012 Time: 4098-1191 OT Time Calculation (min): 14 min  OT Assessment / Plan / Recommendation Comments on Treatment Session  Pt stronger today during OT session    Follow Up Recommendations    HHOT   Equipment Recommendations  None recommended by OT       Plan Discharge plan remains appropriate           ADL  Grooming: Wash/dry hands;Wash/dry face;Performed Where Assessed - Grooming: Standing at sink Toilet Transfer: Dentist: Regular height toilet;Grab bars Toileting - Clothing Manipulation: Performed;Supervision/safety Where Assessed - Glass blower/designer Manipulation: Standing Toileting - Hygiene: Supervision/safety Where Assessed - Toileting Hygiene: Standing Ambulation Related to ADLs: Pt much better today and ready to go home.  Discussed pacing self at home and taking rest breaks as needed.  Pt verbalized understanding    OT Goals ADL Goals ADL Goal: Grooming - Progress: Progressing toward goals ADL Goal: Toilet Transfer - Progress: Progressing toward goals  Visit Information  Assistance Needed: +1       Prior Functioning    I with all ADL activity   Cognition  Overall Cognitive Status: Appears within functional limits for tasks assessed/performed Arousal/Alertness: Awake/alert Orientation Level: Appears intact for tasks assessed Behavior During Session: [facility] for tasks performed    Mobility Transfers Sit to Stand: 6: Modified independent (Device/Increase time);With upper extremity assist;From chair/3-in-1 Stand to Sit: 6: Modified independent (Device/Increase time);With upper extremity assist;With armrests;To chair/3-in-1         End of Session OT - End of Session Activity Tolerance: Patient tolerated treatment well Patient left: in chair   Annaleese Guier, [place] 03/05/2012, 12:02 PM

## 2012-03-05 NOTE — Discharge Summary (Signed)
HOSPITAL DISCHARGE SUMMARY  Gina [name]  MRN: [id number]  DOB:Oct 12, 1925  Date of Admission: 03/02/2012 Date of Discharge: 03/05/2012         LOS: 3 days   Attending Physician:  Clydia Llano A  Patient's PCP:  Elby Showers, MD, MD  Consults: None  Discharge Diagnoses: Present on Admission:  .PNA (pneumonia) .Pleuritic chest pain .Atrial fibrillation .Sick sinus syndrome .Thyroid disease   Medication List  As of 03/05/2012 11:58 AM   TAKE these medications         amiodarone 200 MG tablet   Commonly known as: PACERONE   Take 200 mg by mouth daily.      amoxicillin-clavulanate 500-125 MG per tablet   Commonly known as: AUGMENTIN   Take 1 tablet (500 mg total) by mouth 2 (two) times daily.      aspirin EC 81 MG tablet   Take 81 mg by mouth daily.      cholecalciferol 1000 UNITS tablet   Commonly known as: VITAMIN D   Take 2,000 Units by mouth daily.      colchicine 0.6 MG tablet   Take 0.6 mg by mouth daily.      enoxaparin 100 MG/ML injection   Commonly known as: LOVENOX   Inject 1 mL (100 mg total) into the skin daily.      folic acid 1 MG tablet   Commonly known as: FOLVITE   Take 2 mg by mouth daily.      furosemide 40 MG tablet   Commonly known as: LASIX   Take 40 mg by mouth daily.      iron polysaccharides 150 MG capsule   Commonly known as: NIFEREX   Take 150 mg by mouth 2 (two) times daily.      levothyroxine 100 MCG tablet   Commonly known as: SYNTHROID, LEVOTHROID   Take 100 mcg by mouth daily.      magnesium oxide 400 MG tablet   Commonly known as: MAG-OX   Take 400 mg by mouth daily.      mulitivitamin with minerals Tabs   Take 1 tablet by mouth daily.      nitroGLYCERIN 0.4 MG SL tablet   Commonly known as: NITROSTAT   Place 0.4 mg under the tongue every 5 (five) minutes as needed. For chest pain      oxyCODONE-acetaminophen 5-325 MG per tablet   Commonly known as: PERCOCET   Take 1 tablet by mouth every 4 (four) hours as  needed for pain.      polyethylene glycol packet   Commonly known as: MIRALAX / GLYCOLAX   Take 17 g by mouth daily as needed.      potassium chloride SA 20 MEQ tablet   Commonly known as: K-DUR,KLOR-CON   Take 20 mEq by mouth daily.      rosuvastatin 10 MG tablet   Commonly known as: CRESTOR   Take 10 mg by mouth daily.      warfarin 5 MG tablet   Commonly known as: COUMADIN   Take 2.5-5 mg by mouth daily. 1/2 tablet every day, mondays and thursdays taking a whole tablet             Brief Admission History: Gina [name] is a 78 y.o. female was past medical history of atrial fibrillation, sick sinus syndrome status post PPM and coronary artery disease. Patient given to the hospital complaining about chest pain. Patient was in her usual state of health until last night about 8 PM  when she developed right-sided chest pain. The pain started about 9/10, sharp, continuous, does not radiate and she noticed that the pain gets worse when she takes deep breath. Patient has some chills. She denies shortness of breath, palpitations, fever, excessive sputum production. Upon initial evaluation in the emergency department patient had chest x-ray showed bibasilar atelectasis versus pneumonia, so she was admitted for further evaluation.  Hospital Course: Present on Admission:  .PNA (pneumonia) .Pleuritic chest pain .Atrial fibrillation .Sick sinus syndrome .Thyroid disease  1. Community-acquired pneumonia: Patient admitted for pleurisy, shortness of breath and productive cough. Chest x-ray the emergency department was consistent with pneumonia. Patient started on azithromycin and Rocephin. Patient also was on Mucinex as mucolytic, bronchodilators and oxygen as needed. Patient did very well and for the past 2 days there is no oxygen need with exercise. Antibiotic switched to oral and she will be appropriate for discharge home today.  2. Acute PE: Patient is on chronic Coumadin for atrial  fibrillation, she she did recently have a back procedure which necessitate to stop but Coumadin. At the time of admission patient INR was 1.2, the patient pneumonia was not that impressive but patient had very severe pleuritic chest pain. CTA scan of the lungs were done and showed right segmental artery PE with a small clot burden. Patient was placed on therapeutic Lovenox dose till the INR comes back to therapeutic level between 2 and 3. At the time of discharge her INR is 1.8. She will have Lovenox for 3 more days.  3. Atrial fibrillation: Heart rate is controlled with amiodarone. Patient has permanent pacemaker for sick sinus syndrome.  4. Liver lesions: On the CTA scan that was incidental findings of indeterminate liver abscesses, worrisome for metastasis. There is 2 lesions measures 3.6x2.3 cm and 1.1 cm with low attenuation. Patient also has high attenuation throughout the liver parenchyma consistent with amiodarone therapy. Upon revision of the previous record. Patient in 2006 had CT abdomen pelvis with contrast showed benign appearing hemangiomas. This is being discussed with patient and her daughter, I recommend further outpatient followup with imaging, like MRI or ultrasound of the liver.  5. Disposition: After evaluation by PT/OT, patient will go home with home health services.  Day of Discharge BP 143/69  Pulse 76  Temp(Src) 98 F (36.7 C) (Oral)  Resp 18  Ht 5\' 1"  (1.549 m)  Wt 66.4 kg (146 lb 6.2 oz)  BMI 27.66 kg/m2  SpO2 93% Physical Exam: GEN: No acute distress, cooperative with exam PSYCH: alert and oriented x4; does not appear anxious does not appear depressed; affect is normal  HEENT: Mucous membranes pink and anicteric;  Mouth: without oral thrush or lesions Eyes: PERRLA; EOM intact;  Neck: no cervical lymphadenopathy nor thyromegaly or carotid bruit; no JVD;  CHEST WALL: No tenderness, symmetrical to breathing bilaterally CHEST: Normal respiration, clear to  auscultation bilaterally  HEART: Regular rate and rhythm; no murmurs, rubs or gallops, S1 and S2 heard  BACK: No kyphosis or scoliosis; no CVA tenderness  ABDOMEN:   soft non-tender; no masses, no organomegaly, normal abdominal bowel sounds; no pannus; no intertriginous candida.  EXTREMITIES: No bone or joint deformity; no edema; no ulcerations.  PULSES: 2+ and symmetric, neurovascularity is intact SKIN: Normal hydration no rash or ulceration, no flushing or suspicious lesions  CNS: Cranial nerves 2-12 grossly intact no focal neurologic deficit, coordination is intact gait not tested    Results for orders placed during the hospital encounter of 03/02/12 (from the past  24 hour(s))  PROTIME-INR     Status: Abnormal   Collection Time   03/05/12  5:22 AM      Component Value Range   Prothrombin Time 21.5 (*) 11.6 - 15.2 (seconds)   INR 1.83 (*) 0.00 - 1.49     Disposition: Home with home health services.   Follow-up Appts: Discharge Orders    Future Orders Please Complete By Expires   Diet - low sodium heart healthy      Increase activity slowly         Follow-up Information    Follow up with [facility], [place] in 1 week.   Contact information:   [address] [place] [address] [place] [address] [phone]          I spent 40 minutes completing paperwork and coordinating discharge efforts.  SignedClydia Llano A 03/05/2012, 11:58 AM

## 2012-03-05 NOTE — Progress Notes (Signed)
ANTICOAGULATION CONSULT NOTE - Follow Up Consult  Pharmacy Consult for Lovenox/Coumadin Indication: Afib and PE  No Known Allergies  Patient Measurements: Height: 5\' 1"  (154.9 cm) Weight: 146 lb 6.2 oz (66.4 kg) IBW/kg (Calculated) : 47.8  Heparin Dosing Weight:    Vital Signs: Temp: 98 F (36.7 C) (05/07 0538) Temp src: Oral (05/07 0538) BP: 143/69 mmHg (05/07 0538) Pulse Rate: 76  (05/07 0538)  Labs:  Basename 03/05/12 0522 03/04/12 0539 03/03/12 0108 03/02/12 1755 03/02/12 0949  HGB -- 11.4* 12.2 -- --  HCT -- 35.1* 37.0 -- --  PLT -- 174 184 -- --  APTT -- -- -- -- --  LABPROT 21.5* 19.1* 17.3* -- --  INR 1.83* 1.57* 1.39 -- --  HEPARINUNFRC -- -- -- -- --  CREATININE -- 0.63 0.80 -- --  CKTOTAL -- -- 29 32 32  CKMB -- -- 1.9 2.0 2.1  TROPONINI -- -- <0.30 <0.30 <0.30   Estimated Creatinine Clearance: 45.6 ml/min (by C-G formula based on Cr of 0.63).  Assessment: 86yof admitted with CP. On coumadin (and amiodarone) PTA for afib. (Home dose is 2.5mg  daily except 5mg  on Mon/Thu). INR on admission was subtherapeutic at 1.26. Patient does report that she was off coumadin for about a week before spinal steroid shots and resumed on 5/2. Ct angio eveals RLL PE with small clot burden.  Anticoag: INR 1.83 for afib and new PE. Needs 5 days overlap of Lovenox/Coumadin, today is day #3/5. No new labs yet today. (INR trend 1.26>>1.39>>1.57>>1.83)  ID: CAP D#4 Rocephin/Azith, afebrile, WBC 9.4 down. Change to po?  CV: hx HLD/CAD/pulm HTN/afib, BP max 149/85 on amiodarone/asa81/lipitor, her CP is atypical, CEs negative  Endo: thyroid dz, gout - colchicine resumed. levothyroxine  GI/Nutrition: Vitamin D, FA, Iron,Magox, MV, K  Liver: liver lesion, masses. Need outpt. F/u.  Goal of Therapy:  INR 2-3   Plan:  Lovenox 65mg  sq q12h until INR>2  Coumadin 5mg  po x 1 again today.   Misty Stanley Stillinger 03/05/2012,8:09 AM

## 2012-03-05 NOTE — Progress Notes (Signed)
Physical Therapy Treatment Patient Details Name: Gina [name] MRN: [id number] DOB: [birth year] Today's Date: 03/05/2012 Time: 4098-1191 PT Time Calculation (min): 15 min  PT Assessment / Plan / Recommendation Comments on Treatment Session  Pt adm with PNA and PE.  Pt doing well with mobility and ready for dc home from PT standpoint.    Follow Up Recommendations  Home health PT;Supervision - Intermittent    Equipment Recommendations  None recommended by PT    Frequency Min 3X/week   Plan Discharge plan remains appropriate    Precautions / Restrictions     Pertinent Vitals/Pain O2 sat 88% on RA after amb.    Mobility  Transfers Sit to Stand: 6: Modified independent (Device/Increase time);With upper extremity assist;From chair/3-in-1 Stand to Sit: 6: Modified independent (Device/Increase time);With upper extremity assist;With armrests;To chair/3-in-1 Ambulation/Gait Ambulation/Gait Assistance: 5: Supervision Ambulation Distance (Feet): 170 Feet Assistive device: Straight cane Gait Pattern: Wide base of support    Exercises     PT Goals Acute Rehab PT Goals PT Goal: Ambulate - Progress: Progressing toward goal  Visit Information  Last PT Received On: 03/05/12 Assistance Needed: +1    Subjective Data  Subjective: "I've been looking for you, pt stated   Cognition  Overall Cognitive Status: Appears within functional limits for tasks assessed/performed Arousal/Alertness: Awake/alert Orientation Level: Appears intact for tasks assessed Behavior During Session: [facility] for tasks performed    Balance  Static Standing Balance Static Standing - Balance Support: No upper extremity supported Static Standing - Level of Assistance: 6: Modified independent (Device/Increase time) Dynamic Standing Balance Dynamic Standing - Balance Support: Right upper extremity supported (with cane) Dynamic Standing - Level of Assistance: 5: Stand by assistance  End of Session PT - End of  Session Activity Tolerance: Patient tolerated treatment well Patient left: in chair;with chair alarm set;with call bell/phone within reach    [facility] 03/05/2012, 11:06 AM  [facility] PT [phone]

## 2012-03-05 NOTE — Progress Notes (Signed)
Discharges instructions reviewed with patient and daughter verbalize and understands . Prescription given to daughter. Skin WNL except scattered bruises noted upper and lower extremities.

## 2012-03-13 ENCOUNTER — Other Ambulatory Visit: Payer: Self-pay | Admitting: Internal Medicine

## 2012-03-15 ENCOUNTER — Other Ambulatory Visit: Payer: Self-pay | Admitting: Internal Medicine

## 2012-03-15 DIAGNOSIS — R9389 Abnormal findings on diagnostic imaging of other specified body structures: Secondary | ICD-10-CM

## 2012-03-19 ENCOUNTER — Other Ambulatory Visit: Payer: Medicare Other

## 2012-03-20 ENCOUNTER — Ambulatory Visit
Admission: RE | Admit: 2012-03-20 | Discharge: 2012-03-20 | Disposition: A | Discharge status: Home / Self Care | Payer: Medicare Other | Source: Ambulatory Visit | Attending: Internal Medicine | Admitting: Internal Medicine

## 2012-03-20 DIAGNOSIS — R9389 Abnormal findings on diagnostic imaging of other specified body structures: Secondary | ICD-10-CM

## 2012-03-20 MED ORDER — IOHEXOL 350 MG/ML SOLN
100.0000 mL | Freq: Once | INTRAVENOUS | Status: AC | PRN
  Administered 2012-03-20: 100 mL via INTRAVENOUS

## 2012-05-09 ENCOUNTER — Other Ambulatory Visit: Payer: Self-pay | Admitting: Internal Medicine

## 2012-05-09 DIAGNOSIS — M7989 Other specified soft tissue disorders: Secondary | ICD-10-CM

## 2012-05-14 ENCOUNTER — Ambulatory Visit
Admission: RE | Admit: 2012-05-14 | Discharge: 2012-05-14 | Disposition: A | Discharge status: Home / Self Care | Payer: Medicare Other | Source: Ambulatory Visit | Attending: Internal Medicine | Admitting: Internal Medicine

## 2012-05-14 DIAGNOSIS — M7989 Other specified soft tissue disorders: Secondary | ICD-10-CM

## 2012-12-23 ENCOUNTER — Encounter: Payer: Self-pay | Admitting: Cardiology

## 2012-12-24 ENCOUNTER — Encounter (HOSPITAL_COMMUNITY): Payer: Self-pay | Admitting: Cardiology

## 2013-02-15 ENCOUNTER — Encounter: Payer: Self-pay | Admitting: Pharmacist Clinician (PhC)/ Clinical Pharmacy Specialist

## 2013-02-15 DIAGNOSIS — I4891 Unspecified atrial fibrillation: Secondary | ICD-10-CM

## 2013-02-15 DIAGNOSIS — Z7901 Long term (current) use of anticoagulants: Secondary | ICD-10-CM

## 2013-02-25 ENCOUNTER — Encounter: Payer: Self-pay | Admitting: Cardiovascular Disease

## 2013-02-27 LAB — PACEMAKER DEVICE OBSERVATION

## 2013-02-28 ENCOUNTER — Encounter: Payer: Self-pay | Admitting: *Deleted

## 2013-03-03 ENCOUNTER — Other Ambulatory Visit (HOSPITAL_COMMUNITY): Payer: Self-pay | Admitting: Cardiovascular Disease

## 2013-03-03 DIAGNOSIS — I4891 Unspecified atrial fibrillation: Secondary | ICD-10-CM

## 2013-03-03 DIAGNOSIS — I509 Heart failure, unspecified: Secondary | ICD-10-CM

## 2013-03-05 ENCOUNTER — Ambulatory Visit (HOSPITAL_COMMUNITY)
Admission: RE | Admit: 2013-03-05 | Discharge: 2013-03-05 | Disposition: A | Discharge status: Home / Self Care | Payer: Medicare Other | Source: Ambulatory Visit | Attending: Cardiovascular Disease | Admitting: Cardiovascular Disease

## 2013-03-05 DIAGNOSIS — I080 Rheumatic disorders of both mitral and aortic valves: Secondary | ICD-10-CM | POA: Insufficient documentation

## 2013-03-05 DIAGNOSIS — I495 Sick sinus syndrome: Secondary | ICD-10-CM | POA: Insufficient documentation

## 2013-03-05 DIAGNOSIS — I10 Essential (primary) hypertension: Secondary | ICD-10-CM | POA: Insufficient documentation

## 2013-03-05 DIAGNOSIS — E785 Hyperlipidemia, unspecified: Secondary | ICD-10-CM | POA: Insufficient documentation

## 2013-03-05 DIAGNOSIS — I079 Rheumatic tricuspid valve disease, unspecified: Secondary | ICD-10-CM | POA: Insufficient documentation

## 2013-03-05 DIAGNOSIS — Z950 Presence of cardiac pacemaker: Secondary | ICD-10-CM | POA: Insufficient documentation

## 2013-03-05 DIAGNOSIS — I509 Heart failure, unspecified: Secondary | ICD-10-CM | POA: Insufficient documentation

## 2013-03-05 DIAGNOSIS — I4891 Unspecified atrial fibrillation: Secondary | ICD-10-CM | POA: Insufficient documentation

## 2013-03-05 NOTE — Progress Notes (Signed)
[facility]   2D echo completed 03/05/2013.   Cindy Charlesa Ehle, RDCS  

## 2013-03-12 ENCOUNTER — Ambulatory Visit (INDEPENDENT_AMBULATORY_CARE_PROVIDER_SITE_OTHER): Payer: Medicare Other | Admitting: Pharmacist Clinician (PhC)/ Clinical Pharmacy Specialist

## 2013-03-12 DIAGNOSIS — I4891 Unspecified atrial fibrillation: Secondary | ICD-10-CM

## 2013-03-12 DIAGNOSIS — Z7901 Long term (current) use of anticoagulants: Secondary | ICD-10-CM

## 2013-03-20 ENCOUNTER — Ambulatory Visit: Payer: Medicare Other | Admitting: Pharmacist Clinician (PhC)/ Clinical Pharmacy Specialist

## 2013-03-25 ENCOUNTER — Other Ambulatory Visit: Payer: Self-pay | Admitting: Cardiovascular Disease

## 2013-03-25 LAB — CBC WITH DIFFERENTIAL/PLATELET
Eosinophils Absolute: 0.2 10*3/uL (ref 0.0–0.7)
Eosinophils Relative: 3 % (ref 0–5)
Lymphs Abs: 1.8 10*3/uL (ref 0.7–4.0)
MCH: 27.1 pg (ref 26.0–34.0)
MCHC: 32.7 g/dL (ref 30.0–36.0)
MCV: 82.7 fL (ref 78.0–100.0)
Platelets: 258 10*3/uL (ref 150–400)
RDW: 17.6 % — ABNORMAL HIGH (ref 11.5–15.5)

## 2013-03-25 LAB — COMPREHENSIVE METABOLIC PANEL
ALT: 24 U/L (ref 0–35)
CO2: 32 mEq/L (ref 19–32)
Creat: 1.43 mg/dL — ABNORMAL HIGH (ref 0.50–1.10)
Total Bilirubin: 1 mg/dL (ref 0.3–1.2)

## 2013-04-08 ENCOUNTER — Telehealth: Payer: Self-pay | Admitting: Cardiovascular Disease

## 2013-04-08 NOTE — Telephone Encounter (Signed)
Needs refill for Folic Acid 1mg   # 180  2 daily   [facility] [address]

## 2013-04-09 NOTE — Telephone Encounter (Signed)
Returned call.  Left message to call back before 4pm.  Last refill authorized on 5.19.14 for #180 w/ 6 refills.

## 2013-04-10 NOTE — Telephone Encounter (Signed)
Pt have still not gotten her Folic Acid-Pharmacist told her to call here-This been going on since last week-she needs her medicine today!

## 2013-04-10 NOTE — Telephone Encounter (Signed)
[facility] called [facility] on [address] And filled the folic acid 1 MG two po daily  # 180 with 6 refills

## 2013-04-22 ENCOUNTER — Encounter: Payer: Self-pay | Admitting: Cardiovascular Disease

## 2013-04-24 ENCOUNTER — Ambulatory Visit: Payer: Medicare Other | Admitting: Pharmacist Clinician (PhC)/ Clinical Pharmacy Specialist

## 2013-04-26 LAB — PACEMAKER DEVICE OBSERVATION

## 2013-06-05 ENCOUNTER — Ambulatory Visit (INDEPENDENT_AMBULATORY_CARE_PROVIDER_SITE_OTHER): Payer: Medicare Other | Admitting: Pharmacist Clinician (PhC)/ Clinical Pharmacy Specialist

## 2013-06-05 DIAGNOSIS — Z7901 Long term (current) use of anticoagulants: Secondary | ICD-10-CM

## 2013-06-05 DIAGNOSIS — I4891 Unspecified atrial fibrillation: Secondary | ICD-10-CM

## 2013-06-05 LAB — POCT INR: INR: 3.3

## 2013-06-09 ENCOUNTER — Telehealth: Payer: Self-pay | Admitting: Cardiovascular Disease

## 2013-06-09 NOTE — Telephone Encounter (Signed)
Legs are swelling-a lot of fluid,he prescribed Zaroxolyn 2.5 mg last time-it helped so much.Would you please call [facility] [address].If not at home call her on her cell phone#[phone] please.

## 2013-06-10 MED ORDER — METOLAZONE 2.5 MG PO TABS: ORAL_TABLET | ORAL | Status: DC

## 2013-06-10 NOTE — Telephone Encounter (Signed)
Message forwarded to J.C. Wildman, LPN to discuss w/ Dr. Weintraub.  This note and paper chart# [id number] placed on Dr. Weintraub's cart.  

## 2013-06-10 NOTE — Telephone Encounter (Signed)
Per Dr. Alanda Amass, pt can take Zaroxolyn 2.5 mg daily x 2 days and then once a week.  Call to [place], pt's daughter, and informed.  Verbalized understanding.  Rx sent to pharmacy.

## 2013-06-23 ENCOUNTER — Ambulatory Visit (INDEPENDENT_AMBULATORY_CARE_PROVIDER_SITE_OTHER): Payer: Medicare Other | Admitting: Pharmacist Clinician (PhC)/ Clinical Pharmacy Specialist

## 2013-06-23 DIAGNOSIS — Z7901 Long term (current) use of anticoagulants: Secondary | ICD-10-CM

## 2013-06-23 DIAGNOSIS — I4891 Unspecified atrial fibrillation: Secondary | ICD-10-CM

## 2013-07-07 ENCOUNTER — Other Ambulatory Visit: Payer: Self-pay | Admitting: Cardiovascular Disease

## 2013-07-07 ENCOUNTER — Other Ambulatory Visit: Payer: Self-pay | Admitting: *Deleted

## 2013-07-07 DIAGNOSIS — I509 Heart failure, unspecified: Secondary | ICD-10-CM

## 2013-07-07 LAB — COMPREHENSIVE METABOLIC PANEL
ALT: 23 U/L (ref 0–35)
AST: 28 U/L (ref 0–37)
CO2: 31 mEq/L (ref 19–32)
Creat: 1.19 mg/dL — ABNORMAL HIGH (ref 0.50–1.10)
Total Bilirubin: 1.2 mg/dL (ref 0.3–1.2)

## 2013-07-07 LAB — CBC WITH DIFFERENTIAL/PLATELET
Basophils Absolute: 0 10*3/uL (ref 0.0–0.1)
Eosinophils Absolute: 0.2 10*3/uL (ref 0.0–0.7)
Eosinophils Relative: 3 % (ref 0–5)
MCH: 26.7 pg (ref 26.0–34.0)
MCHC: 32.3 g/dL (ref 30.0–36.0)
MCV: 82.9 fL (ref 78.0–100.0)
Platelets: 245 10*3/uL (ref 150–400)
RDW: 17.6 % — ABNORMAL HIGH (ref 11.5–15.5)

## 2013-07-07 LAB — MAGNESIUM: Magnesium: 1.7 mg/dL (ref 1.5–2.5)

## 2013-07-07 LAB — PACEMAKER DEVICE OBSERVATION

## 2013-07-16 ENCOUNTER — Telehealth: Payer: Self-pay | Admitting: Cardiovascular Disease

## 2013-07-16 NOTE — Telephone Encounter (Signed)
Message forwarded to [facility]. Berlinda Last, LPN to discuss w/ Dr. Alanda Amass.  This note and paper chart# [id number] placed on Dr. Kandis Cocking cart.

## 2013-07-16 NOTE — Telephone Encounter (Signed)
Patient needs refill on Tramadol 50 mg [facility] [address]

## 2013-07-21 ENCOUNTER — Ambulatory Visit: Payer: Medicare Other | Admitting: Pharmacist Clinician (PhC)/ Clinical Pharmacy Specialist

## 2013-07-24 ENCOUNTER — Ambulatory Visit (INDEPENDENT_AMBULATORY_CARE_PROVIDER_SITE_OTHER): Payer: Medicare Other | Admitting: Pharmacist Clinician (PhC)/ Clinical Pharmacy Specialist

## 2013-07-24 ENCOUNTER — Ambulatory Visit (HOSPITAL_COMMUNITY)
Admission: RE | Admit: 2013-07-24 | Discharge: 2013-07-24 | Disposition: A | Discharge status: Home / Self Care | Payer: Medicare Other | Source: Ambulatory Visit | Attending: Cardiovascular Disease | Admitting: Cardiovascular Disease

## 2013-07-24 DIAGNOSIS — I4891 Unspecified atrial fibrillation: Secondary | ICD-10-CM

## 2013-07-24 DIAGNOSIS — I509 Heart failure, unspecified: Secondary | ICD-10-CM | POA: Insufficient documentation

## 2013-07-24 DIAGNOSIS — Z7901 Long term (current) use of anticoagulants: Secondary | ICD-10-CM

## 2013-07-24 NOTE — Progress Notes (Signed)
2D Echo Performed 07/24/2013    Khristian Seals, RCS  

## 2013-07-27 ENCOUNTER — Emergency Department (HOSPITAL_COMMUNITY): Payer: Medicare Other

## 2013-07-27 ENCOUNTER — Encounter (HOSPITAL_COMMUNITY): Payer: Self-pay | Admitting: *Deleted

## 2013-07-27 ENCOUNTER — Inpatient Hospital Stay (HOSPITAL_COMMUNITY)
Admission: EM | Admit: 2013-07-27 | Discharge: 2013-07-30 | DRG: 291 | Disposition: E | Payer: Medicare Other | Attending: Internal Medicine | Admitting: Internal Medicine

## 2013-07-27 DIAGNOSIS — I447 Left bundle-branch block, unspecified: Secondary | ICD-10-CM | POA: Diagnosis present

## 2013-07-27 DIAGNOSIS — E785 Hyperlipidemia, unspecified: Secondary | ICD-10-CM

## 2013-07-27 DIAGNOSIS — K219 Gastro-esophageal reflux disease without esophagitis: Secondary | ICD-10-CM | POA: Diagnosis present

## 2013-07-27 DIAGNOSIS — E7800 Pure hypercholesterolemia, unspecified: Secondary | ICD-10-CM | POA: Diagnosis present

## 2013-07-27 DIAGNOSIS — I272 Pulmonary hypertension, unspecified: Secondary | ICD-10-CM | POA: Diagnosis present

## 2013-07-27 DIAGNOSIS — E039 Hypothyroidism, unspecified: Secondary | ICD-10-CM | POA: Diagnosis present

## 2013-07-27 DIAGNOSIS — I495 Sick sinus syndrome: Secondary | ICD-10-CM | POA: Diagnosis present

## 2013-07-27 DIAGNOSIS — R0601 Orthopnea: Secondary | ICD-10-CM

## 2013-07-27 DIAGNOSIS — I4891 Unspecified atrial fibrillation: Secondary | ICD-10-CM

## 2013-07-27 DIAGNOSIS — R0609 Other forms of dyspnea: Secondary | ICD-10-CM

## 2013-07-27 DIAGNOSIS — H9190 Unspecified hearing loss, unspecified ear: Secondary | ICD-10-CM | POA: Diagnosis present

## 2013-07-27 DIAGNOSIS — N289 Disorder of kidney and ureter, unspecified: Secondary | ICD-10-CM | POA: Diagnosis present

## 2013-07-27 DIAGNOSIS — I429 Cardiomyopathy, unspecified: Secondary | ICD-10-CM | POA: Diagnosis present

## 2013-07-27 DIAGNOSIS — E079 Disorder of thyroid, unspecified: Secondary | ICD-10-CM | POA: Diagnosis present

## 2013-07-27 DIAGNOSIS — I469 Cardiac arrest, cause unspecified: Secondary | ICD-10-CM | POA: Diagnosis not present

## 2013-07-27 DIAGNOSIS — Z950 Presence of cardiac pacemaker: Secondary | ICD-10-CM | POA: Diagnosis present

## 2013-07-27 DIAGNOSIS — I5023 Acute on chronic systolic (congestive) heart failure: Principal | ICD-10-CM

## 2013-07-27 DIAGNOSIS — J4489 Other specified chronic obstructive pulmonary disease: Secondary | ICD-10-CM | POA: Diagnosis present

## 2013-07-27 DIAGNOSIS — Z7901 Long term (current) use of anticoagulants: Secondary | ICD-10-CM

## 2013-07-27 DIAGNOSIS — J9620 Acute and chronic respiratory failure, unspecified whether with hypoxia or hypercapnia: Secondary | ICD-10-CM | POA: Diagnosis present

## 2013-07-27 DIAGNOSIS — M109 Gout, unspecified: Secondary | ICD-10-CM | POA: Diagnosis present

## 2013-07-27 DIAGNOSIS — I2510 Atherosclerotic heart disease of native coronary artery without angina pectoris: Secondary | ICD-10-CM | POA: Diagnosis present

## 2013-07-27 DIAGNOSIS — Z951 Presence of aortocoronary bypass graft: Secondary | ICD-10-CM

## 2013-07-27 DIAGNOSIS — I2789 Other specified pulmonary heart diseases: Secondary | ICD-10-CM | POA: Diagnosis present

## 2013-07-27 DIAGNOSIS — Z96649 Presence of unspecified artificial hip joint: Secondary | ICD-10-CM

## 2013-07-27 DIAGNOSIS — R0600 Dyspnea, unspecified: Secondary | ICD-10-CM | POA: Diagnosis present

## 2013-07-27 DIAGNOSIS — I872 Venous insufficiency (chronic) (peripheral): Secondary | ICD-10-CM | POA: Diagnosis present

## 2013-07-27 DIAGNOSIS — R531 Weakness: Secondary | ICD-10-CM | POA: Diagnosis present

## 2013-07-27 DIAGNOSIS — J449 Chronic obstructive pulmonary disease, unspecified: Secondary | ICD-10-CM | POA: Diagnosis present

## 2013-07-27 DIAGNOSIS — I509 Heart failure, unspecified: Secondary | ICD-10-CM | POA: Diagnosis present

## 2013-07-27 DIAGNOSIS — Z79899 Other long term (current) drug therapy: Secondary | ICD-10-CM

## 2013-07-27 DIAGNOSIS — I959 Hypotension, unspecified: Secondary | ICD-10-CM | POA: Diagnosis present

## 2013-07-27 DIAGNOSIS — L80 Vitiligo: Secondary | ICD-10-CM | POA: Diagnosis present

## 2013-07-27 LAB — URINALYSIS, ROUTINE W REFLEX MICROSCOPIC
Bilirubin Urine: NEGATIVE
Ketones, ur: NEGATIVE mg/dL
Nitrite: NEGATIVE
Protein, ur: NEGATIVE mg/dL
pH: 8 (ref 5.0–8.0)

## 2013-07-27 LAB — CBC
Hemoglobin: 14.3 g/dL (ref 12.0–15.0)
MCH: 28.1 pg (ref 26.0–34.0)
MCV: 82.1 fL (ref 78.0–100.0)
RBC: 5.08 MIL/uL (ref 3.87–5.11)
RDW: 17.7 % — ABNORMAL HIGH (ref 11.5–15.5)

## 2013-07-27 LAB — COMPREHENSIVE METABOLIC PANEL
Alkaline Phosphatase: 125 U/L — ABNORMAL HIGH (ref 39–117)
BUN: 37 mg/dL — ABNORMAL HIGH (ref 6–23)
CO2: 25 mEq/L (ref 19–32)
Calcium: 9 mg/dL (ref 8.4–10.5)
Chloride: 95 mEq/L — ABNORMAL LOW (ref 96–112)
Creatinine, Ser: 1.47 mg/dL — ABNORMAL HIGH (ref 0.50–1.10)
GFR calc Af Amer: 36 mL/min — ABNORMAL LOW (ref >90)
Glucose, Bld: 95 mg/dL (ref 70–99)
Potassium: 4.8 mEq/L (ref 3.5–5.1)
Total Bilirubin: 1.9 mg/dL — ABNORMAL HIGH (ref 0.3–1.2)

## 2013-07-27 LAB — URINE MICROSCOPIC-ADD ON

## 2013-07-27 LAB — TROPONIN I
Troponin I: <0.3 ng/mL (ref <0.30)
Troponin I: <0.3 ng/mL (ref <0.30)
Troponin I: <0.3 ng/mL (ref <0.30)

## 2013-07-27 LAB — PROTIME-INR: INR: 2.72 — ABNORMAL HIGH (ref 0.00–1.49)

## 2013-07-27 MED ORDER — ATORVASTATIN CALCIUM 10 MG PO TABS
10.0000 mg | ORAL_TABLET | Freq: Every day | ORAL | Status: DC
  Administered 2013-07-27 – 2013-07-28 (×2): 10 mg via ORAL
  Filled 2013-07-27 (×2): qty 1

## 2013-07-27 MED ORDER — KETOROLAC TROMETHAMINE 0.5 % OP SOLN
1.0000 [drp] | Freq: Four times a day (QID) | OPHTHALMIC | Status: DC
  Administered 2013-07-27 – 2013-07-28 (×5): 1 [drp] via OPHTHALMIC
  Filled 2013-07-27: qty 3

## 2013-07-27 MED ORDER — OFLOXACIN 0.3 % OP SOLN
1.0000 [drp] | Freq: Four times a day (QID) | OPHTHALMIC | Status: DC
Start: 2013-07-27 — End: 2013-07-29
  Administered 2013-07-27 – 2013-07-28 (×5): 1 [drp] via OPHTHALMIC
  Filled 2013-07-27: qty 5

## 2013-07-27 MED ORDER — PREDNISOLONE ACETATE 1 % OP SUSP
1.0000 [drp] | Freq: Two times a day (BID) | OPHTHALMIC | Status: DC
  Administered 2013-07-27 – 2013-07-28 (×2): 1 [drp] via OPHTHALMIC
  Filled 2013-07-27: qty 1

## 2013-07-27 MED ORDER — ACETAMINOPHEN 325 MG PO TABS
650.0000 mg | ORAL_TABLET | ORAL | Status: DC | PRN
  Administered 2013-07-28: 650 mg via ORAL
  Filled 2013-07-27: qty 2

## 2013-07-27 MED ORDER — SODIUM CHLORIDE 0.9 % IJ SOLN: 3.0000 mL | INTRAMUSCULAR | Status: DC | PRN

## 2013-07-27 MED ORDER — KETOROLAC TROMETHAMINE 0.4 % OP SOLN: 1.0000 [drp] | Freq: Four times a day (QID) | OPHTHALMIC | Status: DC

## 2013-07-27 MED ORDER — WARFARIN - PHARMACIST DOSING INPATIENT
Freq: Every day | Status: DC
  Administered 2013-07-27 – 2013-07-28 (×2)

## 2013-07-27 MED ORDER — POLYETHYLENE GLYCOL 3350 17 G PO PACK
17.0000 g | PACK | Freq: Every day | ORAL | Status: DC | PRN
  Filled 2013-07-27: qty 1

## 2013-07-27 MED ORDER — METOPROLOL TARTRATE 12.5 MG HALF TABLET
12.5000 mg | ORAL_TABLET | Freq: Two times a day (BID) | ORAL | Status: DC
  Administered 2013-07-27: 12.5 mg via ORAL
  Filled 2013-07-27 (×3): qty 1

## 2013-07-27 MED ORDER — FUROSEMIDE 10 MG/ML IJ SOLN
40.0000 mg | Freq: Once | INTRAMUSCULAR | Status: AC
  Administered 2013-07-27: 40 mg via INTRAVENOUS
  Filled 2013-07-27: qty 4

## 2013-07-27 MED ORDER — INFLUENZA VAC SPLIT QUAD 0.5 ML IM SUSP
0.5000 mL | INTRAMUSCULAR | Status: AC
  Administered 2013-07-28: 11:00:00 0.5 mL via INTRAMUSCULAR
  Filled 2013-07-27: qty 0.5

## 2013-07-27 MED ORDER — DEXTROSE 5 % IV SOLN
1.0000 g | INTRAVENOUS | Status: DC
  Administered 2013-07-28: 1 g via INTRAVENOUS
  Filled 2013-07-27: qty 10

## 2013-07-27 MED ORDER — AZITHROMYCIN 250 MG PO TABS
500.0000 mg | ORAL_TABLET | Freq: Once | ORAL | Status: AC
  Administered 2013-07-27: 500 mg via ORAL
  Filled 2013-07-27: qty 2

## 2013-07-27 MED ORDER — SODIUM CHLORIDE 0.9 % IJ SOLN
3.0000 mL | Freq: Two times a day (BID) | INTRAMUSCULAR | Status: DC
  Administered 2013-07-27 – 2013-07-28 (×2): 3 mL via INTRAVENOUS

## 2013-07-27 MED ORDER — SODIUM CHLORIDE 0.9 % IV SOLN: 250.0000 mL | INTRAVENOUS | Status: DC | PRN

## 2013-07-27 MED ORDER — WARFARIN SODIUM 2.5 MG PO TABS: 2.5000 mg | ORAL_TABLET | Freq: Every day | ORAL | Status: DC

## 2013-07-27 MED ORDER — LATANOPROST 0.005 % OP SOLN
1.0000 [drp] | Freq: Every day | OPHTHALMIC | Status: DC
Start: 2013-07-27 — End: 2013-07-29
  Administered 2013-07-27: 1 [drp] via OPHTHALMIC
  Filled 2013-07-27: qty 2.5

## 2013-07-27 MED ORDER — ONDANSETRON HCL 4 MG/2ML IJ SOLN: 4.0000 mg | Freq: Four times a day (QID) | INTRAMUSCULAR | Status: DC | PRN

## 2013-07-27 MED ORDER — LISINOPRIL 2.5 MG PO TABS
2.5000 mg | ORAL_TABLET | Freq: Every day | ORAL | Status: DC
  Filled 2013-07-27: qty 1

## 2013-07-27 MED ORDER — ASPIRIN EC 81 MG PO TBEC
81.0000 mg | DELAYED_RELEASE_TABLET | Freq: Every day | ORAL | Status: DC
  Administered 2013-07-27 – 2013-07-28 (×2): 81 mg via ORAL
  Filled 2013-07-27 (×2): qty 1

## 2013-07-27 MED ORDER — CEFTRIAXONE SODIUM 1 G IJ SOLR
1.0000 g | INTRAMUSCULAR | Status: DC
  Administered 2013-07-27: 1 g via INTRAVENOUS
  Filled 2013-07-27: qty 10

## 2013-07-27 MED ORDER — FUROSEMIDE 10 MG/ML IJ SOLN
INTRAMUSCULAR | Status: AC
  Filled 2013-07-27: qty 4

## 2013-07-27 MED ORDER — LEVOTHYROXINE SODIUM 100 MCG PO TABS
100.0000 ug | ORAL_TABLET | Freq: Every day | ORAL | Status: DC
  Administered 2013-07-28: 100 ug via ORAL
  Filled 2013-07-27 (×2): qty 1

## 2013-07-27 MED ORDER — FUROSEMIDE 10 MG/ML IJ SOLN
40.0000 mg | Freq: Two times a day (BID) | INTRAMUSCULAR | Status: DC
  Administered 2013-07-27 – 2013-07-28 (×3): 40 mg via INTRAVENOUS
  Filled 2013-07-27 (×2): qty 4

## 2013-07-27 MED ORDER — AMIODARONE HCL 100 MG PO TABS
100.0000 mg | ORAL_TABLET | Freq: Every day | ORAL | Status: DC
  Administered 2013-07-27 – 2013-07-28 (×2): 100 mg via ORAL
  Filled 2013-07-27 (×2): qty 1

## 2013-07-27 MED ORDER — DEXTROSE 5 % IV SOLN
500.0000 mg | INTRAVENOUS | Status: DC
  Administered 2013-07-28: 16:00:00 500 mg via INTRAVENOUS
  Filled 2013-07-27: qty 500

## 2013-07-27 MED ORDER — WARFARIN 1.25 MG HALF TABLET
1.2500 mg | ORAL_TABLET | Freq: Once | ORAL | Status: AC
  Administered 2013-07-27: 1.25 mg via ORAL
  Filled 2013-07-27: qty 1

## 2013-07-27 NOTE — H&P (Addendum)
[facility] History and Physical  Gina [name] ZOX:[id number] DOB: May 30, 1925 DOA: 07/19/2013  Referring physician: Cathren Laine MD PCP: Pcp Not In System  Specialists:   Chief Complaint: Shortness of breath  HPI: Gina [name] is a 79 y.o. female with a past medical history of chronic systolic congestive heart failure, with her last transthoracic echocardiogram performed on 07/24/2013 showed an ejection fraction of 30-35%, history of sick sinus syndrome, atrial fibrillation, on chronic anticoagulation with warfarin, who presents to the emergency department with complaints of worsening dyspnea on exertion. Patient reports having dyspnea over the last several weeks, progressively worsening, associated with dry cough, or increasing bilateral extremity pitting edema, weight gain, 2-3 pillow orthopnea, chest tightness, paroxysmal nocturnal dyspnea, generalized weakness and having an overall functional decline. She has not had recent falls, with her last fall being approximately 6 months ago. Patient reporting chest tightness to be located in the retrosternal region, characterized as constant. She denies nausea, vomiting, fevers, chills, abdominal pain, dysuria, hematuria, unilateral weakness, sick contacts. In the emergency department she had an elevated BNP of 7957. Chest x-ray showing cardiomegaly with increased opacification of a retro cardiac left lower lobe. Initial set of cardiac enzymes were negative.  Review of Systems: The patient denies anorexia, fever, weight loss,, vision loss, decreased hearing, hoarseness, syncope, balance deficits, hemoptysis, abdominal pain, melena, hematochezia, severe indigestion/heartburn, hematuria, incontinence, genital sores, muscle weakness, suspicious skin lesions, transient blindness, difficulty walking, depression, unusual weight change, abnormal bleeding, enlarged lymph nodes, angioedema, and breast masses.   Past Medical History  Diagnosis Date   . Thyroid disease   . Hypercholesteremia   . Gout   . Hearing impaired   . Pulmonary hypertension   . Chronic lung disease   . Atrial fibrillation     RFA of flutter 2009  . Sick sinus syndrome 10/10    PTVDP- [employer] Jude  . GERD (gastroesophageal reflux disease)   . Venous insufficiency   . Vitiligo   . Hypertrophic cardiomyopathy 02-13-2012    EF55% on 2-d-echo 02-13-2012  . Chronic anticoagulation   . Pacemaker 10/10    [employer] Jude  . Normal cardiac stress test 11-16-2006    low risk scan,no significant ischemia demonstrated.  compared to previous study,there is no significant change  . History of cardioversion 07-05-2007  . Hx of cardiac cath 04-13-2005  . Coronary artery disease 1998    CABG '98, patent grafts '06 and 10/10   Past Surgical History  Procedure Laterality Date  . Coronary artery bypass graft  1998    LIMA-LAD-Dx  . Pacemaker insertion  10/10    St Jude  . Revision total hip arthroplasty  '08 and 3/12  . Abdominal hysterectomy    . Cardiac electrophysiology study and ablation  2009  . Cataract extraction  2011  . Cardiac catheterization  1927-05-27    recommendation is medical therapy  . Eye surgery Left     cataract   Social History:  reports that she has never smoked. She has never used smokeless tobacco. She reports that she does not drink alcohol or use illicit drugs. Patient is married, resides locally with her husband. She does not drink or smoke. Patient is independent with activities of daily living.  No Known Allergies  Family History Positive for hypertension and coronary disease  Prior to Admission medications   Medication Sig Start Date End Date Taking? Authorizing Provider  alendronate (FOSAMAX) 70 MG tablet Take 70 mg by mouth every 7 (seven) days. Take  with a full glass of water on an empty stomach.   Yes Historical Provider, MD  amiodarone (PACERONE) 200 MG tablet Take 100 mg by mouth daily.    Yes Historical Provider, MD  aspirin EC 81 MG  tablet Take 81 mg by mouth daily.   Yes Historical Provider, MD  cholecalciferol (VITAMIN D) 1000 UNITS tablet Take 1,000 Units by mouth daily.    Yes Historical Provider, MD  colchicine 0.6 MG tablet Take 0.6 mg by mouth daily.   Yes Historical Provider, MD  folic acid (FOLVITE) 1 MG tablet Take 2 mg by mouth daily.   Yes Historical Provider, MD  furosemide (LASIX) 40 MG tablet Take 60 mg by mouth daily.    Yes Historical Provider, MD  iron polysaccharides (NIFEREX) 150 MG capsule Take 150 mg by mouth 2 (two) times daily.   Yes Historical Provider, MD  ketorolac (ACULAR) 0.4 % SOLN Place 1 drop into both eyes 4 (four) times daily.   Yes Historical Provider, MD  latanoprost (XALATAN) 0.005 % ophthalmic solution Place 1 drop into both eyes at bedtime. 07/23/13  Yes Historical Provider, MD  levothyroxine (SYNTHROID, LEVOTHROID) 100 MCG tablet Take 100 mcg by mouth daily.   Yes Historical Provider, MD  magnesium oxide (MAG-OX) 400 MG tablet Take 400 mg by mouth daily.   Yes Historical Provider, MD  metolazone (ZAROXOLYN) 2.5 MG tablet Take 2.5 mg by mouth once a week.   Yes Historical Provider, MD  metoprolol succinate (TOPROL-XL) 50 MG 24 hr tablet Take 50 mg by mouth daily. Take with or immediately following a meal.   Yes Historical Provider, MD  Multiple Vitamin (MULITIVITAMIN WITH MINERALS) TABS Take 1 tablet by mouth daily.   Yes Historical Provider, MD  nitroGLYCERIN (NITROSTAT) 0.4 MG SL tablet Place 0.4 mg under the tongue every 5 (five) minutes as needed. For chest pain   Yes Historical Provider, MD  ofloxacin (OCUFLOX) 0.3 % ophthalmic solution Place 1 drop into both eyes 4 (four) times daily.   Yes Historical Provider, MD  polyethylene glycol (MIRALAX / GLYCOLAX) packet Take 17 g by mouth daily as needed (for constipationa).    Yes Historical Provider, MD  potassium chloride SA (K-DUR,KLOR-CON) 20 MEQ tablet Take 20 mEq by mouth daily.   Yes Historical Provider, MD  prednisoLONE acetate (PRED  FORTE) 1 % ophthalmic suspension Place 1 drop into both eyes 2 (two) times daily.   Yes Historical Provider, MD  rosuvastatin (CRESTOR) 10 MG tablet Take 10 mg by mouth daily.   Yes Historical Provider, MD  traMADol (ULTRAM) 50 MG tablet Take 50 mg by mouth 2 (two) times daily as needed for pain.   Yes Historical Provider, MD  warfarin (COUMADIN) 5 MG tablet Take 2.5 mg by mouth daily. 1/2 tablet every day, mondays and thursdays taking a whole table   Yes Historical Provider, MD   Physical Exam: Filed Vitals:   08/08/13 1515  BP: 122/91  Pulse: 73  Temp:   Resp: 27     General:  Patient is in no acute distress, awake alert well-nourished well-developed.  Eyes: Extraocular movement is intact no sclera icterus  Neck: Neck supple symmetrical, she has positive jugular venous distention, no bruit  Cardiovascular: Irregular rate and rhythm, normal S1-S2, 2/6 systolic ejection murmur, 3+ bilateral extremity pitting  Respiratory: Has by basilar crackles and rales, she appears mildly dyspneic, on supplemental oxygen  Abdomen: Soft nontender nondistended positive bowel sounds  Skin: Patient having venous stasis changes to lower  extremities, several skin tears noted to lower extremities  Musculoskeletal: Preserved range of motion of all extremity  Psychiatric: Awake alert oriented x3  Neurologic: Cranial nerves 2-12 grossly intact, no alteration to sensation global 5 of 5 muscle strength  Labs on Admission:  Basic Metabolic Panel:  Recent Labs Lab 07/08/2013 1221  NA 134*  K 4.8  CL 95*  CO2 25  GLUCOSE 95  BUN 37*  CREATININE 1.47*  CALCIUM 9.0   Liver Function Tests:  Recent Labs Lab 07/25/2013 1221  AST 32  ALT 26  ALKPHOS 125*  BILITOT 1.9*  PROT 7.2  ALBUMIN 3.5   No results found for this basename: LIPASE, AMYLASE,  in the last 168 hours No results found for this basename: AMMONIA,  in the last 168 hours CBC:  Recent Labs Lab 07/26/2013 1215  WBC 4.7  HGB  14.3  HCT 41.7  MCV 82.1  PLT 234   Cardiac Enzymes:  Recent Labs Lab 07/10/2013 1221  TROPONINI <0.30    BNP (last 3 results)  Recent Labs  07/13/2013 1221  PROBNP 7957.0*   CBG: No results found for this basename: GLUCAP,  in the last 168 hours  Radiological Exams on Admission: Dg Chest Port 1 View  07/19/2013   CLINICAL DATA:  Shortness of breath. Hypoxia. Peripheral edema.  EXAM: PORTABLE CHEST - 1 VIEW  COMPARISON:  03/03/2012  FINDINGS: Increased opacity is seen in the left retrocardiac lung base since previous study. Improved aeration of both lungs is seen. Right lung remains clear. Cardiomegaly is stable. No evidence of congestive heart failure. Dual lead transvenous pacemaker remains in appropriate position.  IMPRESSION: Increased opacification of retrocardiac left lower lobe.  Stable cardiomegaly.   Electronically Signed   By: Myles Rosenthal   On: 07/02/2013 12:35     Assessment/Plan Active Problems:   Thyroid disease   Atrial fibrillation   Sick sinus syndrome   Acute on chronic systolic heart failure   Dyspnea on exertion   Orthopnea   Dyslipidemia   Weakness generalized   1. Acute on chronic systolic congestive heart failure. Most recent transthoracic echocardiogram performed on 07/24/2013 which showed ejection fraction of 30-35%. Prior to this she had an ejection fraction of 45-50% based on an echo on 03/05/2013. She presents with clinical signs and symptoms consistent with acute decompensated congestive heart failure. Patient on 60 mg of Lasix daily at home. Will start IV Lasix at 40 mg  twice a day. I consulted cardiology, spoke with Dr Allyson Sabal.  Will place her on continuous cardiac monitoring, cycle cardiac enzymes, daily weights, strict ins and outs. Will continue her beta blocker therapy, add ACE inhibitor therapy with lisinopril given low ejection fraction.  2. Coronary artery disease. Status post coronary artery bypass grafting in 1998, with her last cardiac  catheterization in May of 2010. EF calculated at at the time to be 50-55%. She reports chest tightness which I suspect is related to acute decompensated heart failure. Initial set of cardiac enzymes are negative. I will cycle cardiac enzymes overnight, meanwhile continue aspirin, beta blocker, ACE inhibitor and statin. I have discussed case with cardiology, Dr Allyson Sabal.  3. Atrial fibrillation. Currently rate controlled. Will continue beta blocker and amiodarone. Patient is also on chronic anticoagulation. 4. Chronic anticoagulation. She presents with a therapeutic INR at 2.72 with PT of 27.9. We'll continue her present dose of warfarin, consult pharmacy for Coumadin management. Order daily PT/INR checks. 5. Dyslipidemia. Continue statin therapy with Lipitor by mouth daily  6. Possible community acquire pneumonia. Chest x-ray showing increased opacification of the retrocardiac left lower lobe. She does report having generalized weakness. Patient was given a dose of Rocephin and azithromycin in emergent apartment. Will continue empiric antibiotic therapy, however this should be reassessed tomorrow morning. 7. Possible urinary tract infection. UA showing pyuria with 7-10 WBCs. She is being covered with Rocephin therapy. 8. Generalized Weakness. Like multifactorial as she presents with acute CHF as well as possible underlying infectious process. Will consult PT.  9. Hypothyroidism. Continue Synthroid 100 mcg by mouth daily, check a TSH. 10. DVT Prophylaxis: On anticoagulation      Code Status: Full Code Family Communication: Plan discussed with patient and family Disposition Plan: Admit patient telemetry, I anticipate she will require greater than 2 nights hospitalization  Time spent: 41  Jeralyn Bennett [facility] Pager [phone]  If 7PM-7AM, please contact night-coverage [email or web address] Password [facility] 07/02/2013, 3:49 PM

## 2013-07-27 NOTE — Progress Notes (Signed)
ANTICOAGULATION CONSULT NOTE - Initial Consult  Pharmacy Consult for Warfarin Indication: atrial fibrillation  No Known Allergies  Patient Measurements: Height: 5\' 1"  (154.9 cm) Weight: 139 lb 7 oz (63.248 kg) IBW/kg (Calculated) : 47.8  Vital Signs: Temp: 97.6 F (36.4 C) (09/28 1153) Temp src: Oral (09/28 1153) BP: 122/91 mmHg (09/28 1515) Pulse Rate: 73 (09/28 1515)  Labs:  Recent Labs  08-16-13 1215 Aug 16, 2013 1221  HGB 14.3  --   HCT 41.7  --   PLT 234  --   LABPROT 27.9*  --   INR 2.72*  --   CREATININE  --  1.47*  TROPONINI  --  <0.30    Estimated Creatinine Clearance: 23.4 ml/min (by C-G formula based on Cr of 1.47).   Medical History: Past Medical History  Diagnosis Date  . Thyroid disease   . Hypercholesteremia   . Gout   . Hearing impaired   . Pulmonary hypertension   . Chronic lung disease   . Atrial fibrillation     RFA of flutter 2009  . Sick sinus syndrome 10/10    PTVDP- [employer] Jude  . GERD (gastroesophageal reflux disease)   . Venous insufficiency   . Vitiligo   . Hypertrophic cardiomyopathy 02-13-2012    EF55% on 2-d-echo 02-13-2012  . Chronic anticoagulation   . Pacemaker 10/10    [employer] Jude  . Normal cardiac stress test 11-16-2006    low risk scan,no significant ischemia demonstrated.  compared to previous study,there is no significant change  . History of cardioversion 07-05-2007  . Hx of cardiac cath 04-13-2005  . Coronary artery disease 1998    CABG '98, patent grafts '06 and 10/10    Assessment: 79 y.o. F who presented to the [facility] on 9/28 with worsening leg swelling and SOB and found to be having an AoCHF exacerbation. Pharmacy was consulted to resume the patient's home warfarin for hx Afib. PTA the patient was taking 2.5 mg daily, INR on admission was therapeutic (INR 2.72, goal of 2-3). Last dose PTA was on 9/27 -- CBC wnl on admit -- no s/sx of bleeding noted.  Will reduce initial dose this admit slightly today in the setting of HF  exacerbation -- will increase back to home dose tomorrow if INR appears stable.   Goal of Therapy:  INR 2-3   Plan:  1. Warfarin 1.25 mg x 1 dose at 1800 today 2. Daily PT/INR 3. Will continue to monitor for any signs/symptoms of bleeding and will follow up with PT/INR in the a.m.   Georgina Pillion, PharmD, BCPS Clinical Pharmacist Pager: [phone] 2013-08-16 5:02 PM

## 2013-07-27 NOTE — Progress Notes (Signed)
On admission to room pt on O2 at 2l [place]. Lips and fingers very cyanotic and cool. Unable to determine sat but after shi\ort while cyanosis resolved some. Appears comfortable at present. Lasix iv given with fair diuresis

## 2013-07-27 NOTE — ED Notes (Addendum)
X-ray at bedside

## 2013-07-27 NOTE — ED Notes (Addendum)
Respiratory at bedside.

## 2013-07-27 NOTE — ED Notes (Signed)
Pt reports leg swelling that has been getting progressively worse and having sob. No acute resp distress noted. Appears weak and pale. ekg done at triage.

## 2013-07-27 NOTE — Consult Note (Signed)
Reason for Consult: CHF  Requesting Physician: [facility]  HPI:   The patient is an 79 year old female well known to Dr. Alanda Amass. She had bypass grafting in 1998 with an LIMA-LAD and Dx. She had patent grafts in 2010. She has a history of hyperdynamic LV function with diastolic dysfunction. Dr. Alanda Amass did not feel she had actual IHSS or obstruction but we do try to prevent hypotension or use of any pressors in her situation. She has a history of paroxysmal atrial fibrillation. She had a remote ablation at [facility]. She had a St. Jude pacemaker implanted in October 2010. She saw Dr. Alanda Amass in January and was noted to be back in atrial flutter. He referred her back to Dr. Severiano Gilbert at [facility]. They tried to cardiovert her but she did not hold. They did not think she was a candidate for another RFA. Since then she has had problems with some lower extremity edema. She has chronic venous insufficiency but this seems to be a little bit worse. She is admitted now with increasing weakness and SOB. An echo done 07/24/13 showed new LVD with an EF of 30-35%. She was in AF when Dr Alanda Amass saw her last 07/07/13. She is on Amiodarone and Coumadin.   PMHx:  Past Medical History  Diagnosis Date  . Thyroid disease   . Hypercholesteremia   . Gout   . Hearing impaired   . Pulmonary hypertension   . Chronic lung disease   . Atrial fibrillation     RFA of flutter 2009  . Sick sinus syndrome 10/10    PTVDP- St Jude  . GERD (gastroesophageal reflux disease)   . Venous insufficiency   . Vitiligo   . Hypertrophic cardiomyopathy 02-13-2012    EF55% on 2-d-echo 02-13-2012  . Chronic anticoagulation   . Pacemaker 10/10    St Jude  . History of cardioversion 07-05-2007  . Coronary artery disease 1998    CABG X 2 '98, patent grafts '06 and 10/10   Past Surgical History  Procedure Laterality Date  . Coronary artery bypass graft  1998    LIMA-LAD-Dx  . Pacemaker insertion  10/10    St Jude  .  Revision total hip arthroplasty  '08 and 3/12  . Abdominal hysterectomy    . Cardiac electrophysiology study and ablation  2009  . Cataract extraction  2011  . Cardiac catheterization  08/13/2009    Patent LIMA-DX/LAD  . Eye surgery Left     cataract    FAMHx: negative for early CAD   SOCHx:  reports that she has never smoked. She has never used smokeless tobacco. She reports that she does not drink alcohol or use illicit drugs.She lives with her husband and daughter  ALLERGIES: No Known Allergies  ROS: Pertinent items are noted in HPI. See H&P for complete details  HOME MEDICATIONS: Prescriptions prior to admission  Medication Sig Dispense Refill  . alendronate (FOSAMAX) 70 MG tablet Take 70 mg by mouth every 7 (seven) days. Take with a full glass of water on an empty stomach.      Marland Kitchen amiodarone (PACERONE) 200 MG tablet Take 100 mg by mouth daily.       Marland Kitchen aspirin EC 81 MG tablet Take 81 mg by mouth daily.      . cholecalciferol (VITAMIN D) 1000 UNITS tablet Take 1,000 Units by mouth daily.       . colchicine 0.6 MG tablet Take 0.6 mg by mouth daily.      Marland Kitchen  folic acid (FOLVITE) 1 MG tablet Take 2 mg by mouth daily.      . furosemide (LASIX) 40 MG tablet Take 60 mg by mouth daily.       . iron polysaccharides (NIFEREX) 150 MG capsule Take 150 mg by mouth 2 (two) times daily.      Marland Kitchen ketorolac (ACULAR) 0.4 % SOLN Place 1 drop into both eyes 4 (four) times daily.      Marland Kitchen latanoprost (XALATAN) 0.005 % ophthalmic solution Place 1 drop into both eyes at bedtime.      Marland Kitchen levothyroxine (SYNTHROID, LEVOTHROID) 100 MCG tablet Take 100 mcg by mouth daily.      . magnesium oxide (MAG-OX) 400 MG tablet Take 400 mg by mouth daily.      . metolazone (ZAROXOLYN) 2.5 MG tablet Take 2.5 mg by mouth once a week.      . metoprolol succinate (TOPROL-XL) 50 MG 24 hr tablet Take 50 mg by mouth daily. Take with or immediately following a meal.      . Multiple Vitamin (MULITIVITAMIN WITH MINERALS) TABS  Take 1 tablet by mouth daily.      . nitroGLYCERIN (NITROSTAT) 0.4 MG SL tablet Place 0.4 mg under the tongue every 5 (five) minutes as needed. For chest pain      . ofloxacin (OCUFLOX) 0.3 % ophthalmic solution Place 1 drop into both eyes 4 (four) times daily.      . polyethylene glycol (MIRALAX / GLYCOLAX) packet Take 17 g by mouth daily as needed (for constipationa).       . potassium chloride SA (K-DUR,KLOR-CON) 20 MEQ tablet Take 20 mEq by mouth daily.      . prednisoLONE acetate (PRED FORTE) 1 % ophthalmic suspension Place 1 drop into both eyes 2 (two) times daily.      . rosuvastatin (CRESTOR) 10 MG tablet Take 10 mg by mouth daily.      . traMADol (ULTRAM) 50 MG tablet Take 50 mg by mouth 2 (two) times daily as needed for pain.      Marland Kitchen warfarin (COUMADIN) 5 MG tablet Take 2.5 mg by mouth daily. The patient's takes 1/2 tablet daily        HOSPITAL MEDICATIONS: I have reviewed the patient's current medications.  VITALS: Blood pressure 122/83, pulse 73, temperature 98.4 F (36.9 C), temperature source Oral, resp. rate 22, height 5\' 1"  (1.549 m), weight 138 lb 14.2 oz (63 kg), SpO2 92.00%.  PHYSICAL EXAM: General appearance: alert, cooperative, no distress and she looks grey, cool, pale Neck: elevated JVP Lungs: decreased breath sounds Lt base with basilar crackles Heart: regular rate and rhythm Abdomen: not tender, not distended Extremities: chronic venous changes, 2 + edema bilat Pulses: diminnished Skin: pale, cool, dry Neurologic: Grossly normal  LABS: Results for orders placed during the hospital encounter of 07/15/2013 (from the past 48 hour(s))  CBC     Status: Abnormal   Collection Time    07/27/2013 12:15 PM      Result Value Range   WBC 4.7  4.0 - 10.5 K/uL   RBC 5.08  3.87 - 5.11 MIL/uL   Hemoglobin 14.3  12.0 - 15.0 g/dL   HCT 16.1  09.6 - 04.5 %   MCV 82.1  78.0 - 100.0 fL   MCH 28.1  26.0 - 34.0 pg   MCHC 34.3  30.0 - 36.0 g/dL   RDW 40.9 (*) 81.1 - 91.4 %    Platelets 234  150 - 400 K/uL  PROTIME-INR     Status: Abnormal   Collection Time    07/20/2013 12:15 PM      Result Value Range   Prothrombin Time 27.9 (*) 11.6 - 15.2 seconds   INR 2.72 (*) 0.00 - 1.49  COMPREHENSIVE METABOLIC PANEL     Status: Abnormal   Collection Time    07/16/2013 12:21 PM      Result Value Range   Sodium 134 (*) 135 - 145 mEq/L   Potassium 4.8  3.5 - 5.1 mEq/L   Chloride 95 (*) 96 - 112 mEq/L   CO2 25  19 - 32 mEq/L   Glucose, Bld 95  70 - 99 mg/dL   BUN 37 (*) 6 - 23 mg/dL   Creatinine, Ser 1.61 (*) 0.50 - 1.10 mg/dL   Calcium 9.0  8.4 - 09.6 mg/dL   Total Protein 7.2  6.0 - 8.3 g/dL   Albumin 3.5  3.5 - 5.2 g/dL   AST 32  0 - 37 U/L   ALT 26  0 - 35 U/L   Alkaline Phosphatase 125 (*) 39 - 117 U/L   Total Bilirubin 1.9 (*) 0.3 - 1.2 mg/dL   GFR calc non Af Amer 31 (*) >90 mL/min   GFR calc Af Amer 36 (*) >90 mL/min   Comment: (NOTE)     The eGFR has been calculated using the CKD EPI equation.     This calculation has not been validated in all clinical situations.     eGFR's persistently <90 mL/min signify possible Chronic Kidney     Disease.  TROPONIN I     Status: None   Collection Time    07/01/2013 12:21 PM      Result Value Range   Troponin I <0.30  <0.30 ng/mL   Comment:            Due to the release kinetics of cTnI,     a negative result within the first hours     of the onset of symptoms does not rule out     myocardial infarction with certainty.     If myocardial infarction is still suspected,     repeat the test at appropriate intervals.  PRO B NATRIURETIC PEPTIDE     Status: Abnormal   Collection Time    07/08/2013 12:21 PM      Result Value Range   Pro B Natriuretic peptide (BNP) 7957.0 (*) 0 - 450 pg/mL  URINALYSIS, ROUTINE W REFLEX MICROSCOPIC     Status: Abnormal   Collection Time    07/26/2013  1:28 PM      Result Value Range   Color, Urine YELLOW  YELLOW   APPearance CLOUDY (*) CLEAR   Specific Gravity, Urine 1.010  1.005 - 1.030    pH 8.0  5.0 - 8.0   Glucose, UA NEGATIVE  NEGATIVE mg/dL   Hgb urine dipstick NEGATIVE  NEGATIVE   Bilirubin Urine NEGATIVE  NEGATIVE   Ketones, ur NEGATIVE  NEGATIVE mg/dL   Protein, ur NEGATIVE  NEGATIVE mg/dL   Urobilinogen, UA 1.0  0.0 - 1.0 mg/dL   Nitrite NEGATIVE  NEGATIVE   Leukocytes, UA LARGE (*) NEGATIVE  URINE MICROSCOPIC-ADD ON     Status: None   Collection Time    07/26/2013  1:28 PM      Result Value Range   Squamous Epithelial / LPF RARE  RARE   WBC, UA 7-10  <3 WBC/hpf   Urine-Other AMORPHOUS URATES/PHOSPHATES  Comment: MICROSCOPIC EXAM PERFORMED ON UNCONCENTRATED URINE  TROPONIN I     Status: None   Collection Time    07/09/2013  5:15 PM      Result Value Range   Troponin I <0.30  <0.30 ng/mL   Comment:            Due to the release kinetics of cTnI,     a negative result within the first hours     of the onset of symptoms does not rule out     myocardial infarction with certainty.     If myocardial infarction is still suspected,     repeat the test at appropriate intervals.  MAGNESIUM     Status: Abnormal   Collection Time    07/18/2013  5:15 PM      Result Value Range   Magnesium 2.6 (*) 1.5 - 2.5 mg/dL    EKG:   IMAGING: Dg Chest Port 1 View  07/22/2013   CLINICAL DATA:  Shortness of breath. Hypoxia. Peripheral edema.  EXAM: PORTABLE CHEST - 1 VIEW  COMPARISON:  03/03/2012  FINDINGS: Increased opacity is seen in the left retrocardiac lung base since previous study. Improved aeration of both lungs is seen. Right lung remains clear. Cardiomegaly is stable. No evidence of congestive heart failure. Dual lead transvenous pacemaker remains in appropriate position.  IMPRESSION: Increased opacification of retrocardiac left lower lobe.  Stable cardiomegaly.   Electronically Signed   By: Myles Rosenthal   On: 07/12/2013 12:35    IMPRESSION: Principal Problem:   Acute on chronic systolic heart failure Active Problems:   Cardiomyopathy- new LVD with an EF of 30-35%  (? from AF)   CAF- prior RFA, multiple DCCV-    Dyspnea on exertion   Weakness generalized   Acute renal insufficiency   Thyroid disease   Pulmonary hypertension   Sick sinus syndrome   Long term (current) use of anticoagulants   Orthopnea   Dyslipidemia   Pacemaker- St Jude 2010   RECOMMENDATION: Dr Allyson Sabal to see. Her new LVD may be from persistent AF. She appears to be low output type CHF. She has received Lasix IV. We will follow along to see how she does. We may be able to discontinue her Amiodarone if we are giving up on converting her to NSR, will review Dr Kandis Cocking last note. Will discontinue ACE with new renal insufficiency, and resume beta blocker at half her home dose.  Time Spent Directly with Patient: 45 minutes  Abelino Derrick [phone] beeper 07/06/2013, 6:01 PM  Agree with note written by Corine Shelter [facility]  Pt of Dr. Cloyde Reams with ischemic HD s/p remote CABG 1998 with patent grafts in 2010 (LIMA to LAD/Diag). Nl LV fxn in past. ? IHSS, CAF s/p ablation in past and PTVPM on oral anticoag. Admitted with CHF. LVEF 35% by 2D (Loer then in past) and BNP increased. Wgt up 4# compared to last office note. Pt has crackles left base and effusion on CXR. Rhythm paced. Mod RI. INR therapeutic. Agree with iv diuresis. Adjust meds. WIll follow with you. Follow renal fxn.    Runell Gess 30-Jul-2013 6:34 AM

## 2013-07-27 NOTE — ED Provider Notes (Signed)
CSN: [id number]     Arrival date & time Aug 02, 2013  1141 History   First MD Initiated Contact with Patient 2013/08/02 1209     Chief Complaint  Patient presents with  . Leg Swelling  . Shortness of Breath   (Consider location/radiation/quality/duration/timing/severity/associated sxs/prior Treatment) Patient is a 79 y.o. female presenting with shortness of breath. The history is provided by the patient and a relative.  Shortness of Breath Associated symptoms: no abdominal pain, no chest pain, no fever, no headaches, no neck pain and no rash   pt with hx cad, afibm, chf, pulm htn, presents w generalized weakness and progressive dyspnea in past 1-2 weeks. Pt states compliant w meds, no recent change in meds or doses. Symptoms persistent/constant. Dyspnea worse w activity/exertion, lying flat. No chest pain or discomfort. Occasional non productive cough. No fever or chills. ?orthopnea. No pnd. +increased leg swelling, bilateral.     Past Medical History  Diagnosis Date  . Thyroid disease   . Hypercholesteremia   . Gout   . Hearing impaired   . Pulmonary hypertension   . Chronic lung disease   . Atrial fibrillation     RFA of flutter 2009  . Sick sinus syndrome 10/10    PTVDP- St Jude  . GERD (gastroesophageal reflux disease)   . Venous insufficiency   . Vitiligo   . Hypertrophic cardiomyopathy 02-13-2012    EF55% on 2-d-echo 02-13-2012  . Chronic anticoagulation   . Pacemaker 10/10    St Jude  . Normal cardiac stress test 11-16-2006    low risk scan,no significant ischemia demonstrated.  compared to previous study,there is no significant change  . History of cardioversion 07-05-2007  . Hx of cardiac cath 04-13-2005  . Coronary artery disease 1998    CABG '98, patent grafts '06 and 10/10   Past Surgical History  Procedure Laterality Date  . Coronary artery bypass graft  1998    LIMA-LAD-Dx  . Pacemaker insertion  10/10    St Jude  . Revision total hip arthroplasty  '08 and 3/12  .  Abdominal hysterectomy    . Cardiac electrophysiology study and ablation  2009  . Cataract extraction  2011  . Cardiac catheterization  1927/03/15    recommendation is medical therapy  . Eye surgery Left     cataract   History reviewed. No pertinent family history. History  Substance Use Topics  . Smoking status: Never Smoker   . Smokeless tobacco: Never Used  . Alcohol Use: No   OB History   Grav Para Term Preterm Abortions TAB SAB Ect Mult Living                 Review of Systems  Constitutional: Negative for fever and chills.  HENT: Negative for neck pain.   Eyes: Negative for redness.  Respiratory: Positive for shortness of breath.   Cardiovascular: Positive for leg swelling. Negative for chest pain.  Gastrointestinal: Negative for abdominal pain.  Genitourinary: Negative for flank pain.  Musculoskeletal: Negative for back pain.  Skin: Negative for rash.  Neurological: Positive for weakness. Negative for headaches.  Hematological: Does not bruise/bleed easily.  Psychiatric/Behavioral: Negative for confusion.    Allergies  Review of patient's allergies indicates no known allergies.  Home Medications   Current Outpatient Rx  Name  Route  Sig  Dispense  Refill  . amiodarone (PACERONE) 200 MG tablet   Oral   Take 200 mg by mouth daily.         Marland Kitchen  aspirin EC 81 MG tablet   Oral   Take 81 mg by mouth daily.         . cholecalciferol (VITAMIN D) 1000 UNITS tablet   Oral   Take 2,000 Units by mouth daily.         . colchicine 0.6 MG tablet   Oral   Take 0.6 mg by mouth daily.         Marland Kitchen EXPIRED: enoxaparin (LOVENOX) 100 MG/ML injection   Subcutaneous   Inject 1 mL (100 mg total) into the skin daily.   3 Syringe   0   . folic acid (FOLVITE) 1 MG tablet   Oral   Take 2 mg by mouth daily.         . furosemide (LASIX) 40 MG tablet   Oral   Take 40 mg by mouth daily.         . iron polysaccharides (NIFEREX) 150 MG capsule   Oral   Take 150 mg  by mouth 2 (two) times daily.         Marland Kitchen levothyroxine (SYNTHROID, LEVOTHROID) 100 MCG tablet   Oral   Take 100 mcg by mouth daily.         . magnesium oxide (MAG-OX) 400 MG tablet   Oral   Take 400 mg by mouth daily.         . metolazone (ZAROXOLYN) 2.5 MG tablet      Take one tab daily x 2 days. Then take one tab every week.   15 tablet   2     Refill(s) sent to pharmacy via [employer].   . Multiple Vitamin (MULITIVITAMIN WITH MINERALS) TABS   Oral   Take 1 tablet by mouth daily.         . nitroGLYCERIN (NITROSTAT) 0.4 MG SL tablet   Sublingual   Place 0.4 mg under the tongue every 5 (five) minutes as needed. For chest pain         . polyethylene glycol (MIRALAX / GLYCOLAX) packet   Oral   Take 17 g by mouth daily as needed.         . potassium chloride SA (K-DUR,KLOR-CON) 20 MEQ tablet   Oral   Take 20 mEq by mouth daily.         . rosuvastatin (CRESTOR) 10 MG tablet   Oral   Take 10 mg by mouth daily.         Marland Kitchen warfarin (COUMADIN) 5 MG tablet   Oral   Take 2.5-5 mg by mouth daily. 1/2 tablet every day, mondays and thursdays taking a whole tablet          BP 143/82  Pulse 72  Temp(Src) 97.6 F (36.4 C) (Oral)  Resp 16  Ht 5\' 1"  (1.549 m)  Wt 139 lb 7 oz (63.248 kg)  BMI 26.36 kg/m2  SpO2 100% Physical Exam  Nursing note and vitals reviewed. Constitutional: She is oriented to person, place, and time. She appears well-developed. She appears distressed.  HENT:  Mouth/Throat: Oropharynx is clear and moist.  Eyes: Conjunctivae are normal. No scleral icterus.  Neck: Neck supple. No tracheal deviation present.  Cardiovascular: Normal rate, regular rhythm, normal heart sounds and intact distal pulses.   Pulmonary/Chest: Effort normal. No respiratory distress.  Basilar rales.   Abdominal: Soft. Normal appearance and bowel sounds are normal. She exhibits no distension. There is no tenderness.  Musculoskeletal: She exhibits edema.  2-3+ bil  lower leg edema, symmetric.  Neurological: She is alert and oriented to person, place, and time.  Skin: Skin is warm and dry. No rash noted.  Psychiatric: She has a normal mood and affect.    ED Course  Procedures (including critical care time)  Results for orders placed during the hospital encounter of 08-06-13  CBC      Result Value Range   WBC 4.7  4.0 - 10.5 K/uL   RBC 5.08  3.87 - 5.11 MIL/uL   Hemoglobin 14.3  12.0 - 15.0 g/dL   HCT 16.1  09.6 - 04.5 %   MCV 82.1  78.0 - 100.0 fL   MCH 28.1  26.0 - 34.0 pg   MCHC 34.3  30.0 - 36.0 g/dL   RDW 40.9 (*) 81.1 - 91.4 %   Platelets 234  150 - 400 K/uL  PROTIME-INR      Result Value Range   Prothrombin Time 27.9 (*) 11.6 - 15.2 seconds   INR 2.72 (*) 0.00 - 1.49  COMPREHENSIVE METABOLIC PANEL      Result Value Range   Sodium 134 (*) 135 - 145 mEq/L   Potassium 4.8  3.5 - 5.1 mEq/L   Chloride 95 (*) 96 - 112 mEq/L   CO2 25  19 - 32 mEq/L   Glucose, Bld 95  70 - 99 mg/dL   BUN 37 (*) 6 - 23 mg/dL   Creatinine, Ser 7.82 (*) 0.50 - 1.10 mg/dL   Calcium 9.0  8.4 - 95.6 mg/dL   Total Protein 7.2  6.0 - 8.3 g/dL   Albumin 3.5  3.5 - 5.2 g/dL   AST 32  0 - 37 U/L   ALT 26  0 - 35 U/L   Alkaline Phosphatase 125 (*) 39 - 117 U/L   Total Bilirubin 1.9 (*) 0.3 - 1.2 mg/dL   GFR calc non Af Amer 31 (*) >90 mL/min   GFR calc Af Amer 36 (*) >90 mL/min  TROPONIN I      Result Value Range   Troponin I <0.30  <0.30 ng/mL  URINALYSIS, ROUTINE W REFLEX MICROSCOPIC      Result Value Range   Color, Urine YELLOW  YELLOW   APPearance CLOUDY (*) CLEAR   Specific Gravity, Urine 1.010  1.005 - 1.030   pH 8.0  5.0 - 8.0   Glucose, UA NEGATIVE  NEGATIVE mg/dL   Hgb urine dipstick NEGATIVE  NEGATIVE   Bilirubin Urine NEGATIVE  NEGATIVE   Ketones, ur NEGATIVE  NEGATIVE mg/dL   Protein, ur NEGATIVE  NEGATIVE mg/dL   Urobilinogen, UA 1.0  0.0 - 1.0 mg/dL   Nitrite NEGATIVE  NEGATIVE   Leukocytes, UA LARGE (*) NEGATIVE  PRO B NATRIURETIC  PEPTIDE      Result Value Range   Pro B Natriuretic peptide (BNP) 7957.0 (*) 0 - 450 pg/mL  URINE MICROSCOPIC-ADD ON      Result Value Range   Squamous Epithelial / LPF RARE  RARE   WBC, UA 7-10  <3 WBC/hpf   Urine-Other AMORPHOUS URATES/PHOSPHATES     Dg Chest Port 1 View  08/06/2013   CLINICAL DATA:  Shortness of breath. Hypoxia. Peripheral edema.  EXAM: PORTABLE CHEST - 1 VIEW  COMPARISON:  03/03/2012  FINDINGS: Increased opacity is seen in the left retrocardiac lung base since previous study. Improved aeration of both lungs is seen. Right lung remains clear. Cardiomegaly is stable. No evidence of congestive heart failure. Dual lead transvenous pacemaker remains in appropriate position.  IMPRESSION:  Increased opacification of retrocardiac left lower lobe.  Stable cardiomegaly.   Electronically Signed   By: Myles Rosenthal   On: 07/13/2013 12:35      MDM  Iv ns. Labs. Pcxr.  Reviewed nursing notes and prior charts for additional history.    Date: 07/02/2013  Rate: 71  Rhythm: ventriclar paced rhythm  QRS Axis: normal  Intervals: normal  ST/T Wave abnormalities: nonspecific ST/T changes and paced rhythm  Conduction Disutrbances:ventricular paced rhythm  Narrative Interpretation:   Old EKG Reviewed: unchanged  bnp elev, significant leg edema. Lasix iv.  Will admit for concern chf/dypsnea/edema.  Given gen weakness, possible uti, possible infil on cxr, will also cover w abx.  u cx pending.   [facility] - will admit. Temp orders, admit, tele.      Suzi Roots, MD 07/15/2013 [phone]

## 2013-07-27 NOTE — ED Notes (Signed)
Pt placed on NRB O2 sats slowly increasing. Pt encouraged to deep breathe.

## 2013-07-28 DIAGNOSIS — N289 Disorder of kidney and ureter, unspecified: Secondary | ICD-10-CM

## 2013-07-28 DIAGNOSIS — Z950 Presence of cardiac pacemaker: Secondary | ICD-10-CM

## 2013-07-28 DIAGNOSIS — R5381 Other malaise: Secondary | ICD-10-CM

## 2013-07-28 DIAGNOSIS — E079 Disorder of thyroid, unspecified: Secondary | ICD-10-CM

## 2013-07-28 DIAGNOSIS — I428 Other cardiomyopathies: Secondary | ICD-10-CM

## 2013-07-28 LAB — BASIC METABOLIC PANEL
BUN: 39 mg/dL — ABNORMAL HIGH (ref 6–23)
Calcium: 8.9 mg/dL (ref 8.4–10.5)
Creatinine, Ser: 1.45 mg/dL — ABNORMAL HIGH (ref 0.50–1.10)
GFR calc non Af Amer: 32 mL/min — ABNORMAL LOW (ref >90)
Glucose, Bld: 75 mg/dL (ref 70–99)
Potassium: 4.4 mEq/L (ref 3.5–5.1)
Sodium: 133 mEq/L — ABNORMAL LOW (ref 135–145)

## 2013-07-28 LAB — PROTIME-INR: Prothrombin Time: 24 seconds — ABNORMAL HIGH (ref 11.6–15.2)

## 2013-07-28 LAB — TROPONIN I: Troponin I: <0.3 ng/mL (ref <0.30)

## 2013-07-28 MED ORDER — BIOTENE DRY MOUTH MT LIQD
15.0000 mL | Freq: Two times a day (BID) | OROMUCOSAL | Status: DC
  Administered 2013-07-28: 08:00:00 15 mL via OROMUCOSAL

## 2013-07-28 MED ORDER — WARFARIN SODIUM 2.5 MG PO TABS
2.5000 mg | ORAL_TABLET | Freq: Once | ORAL | Status: AC
  Administered 2013-07-28: 17:00:00 2.5 mg via ORAL
  Filled 2013-07-28: qty 1

## 2013-07-29 ENCOUNTER — Encounter: Payer: Self-pay | Admitting: Cardiovascular Disease

## 2013-07-29 NOTE — Progress Notes (Signed)
Utilization Review Completed.   Dusti Tetro, RN, BSN Nurse Case Manager  [phone]  

## 2013-07-30 NOTE — Discharge Summary (Signed)
Death Summary  CAMIE HAUSS VHQ:[id number] DOB: 06-06-1925 DOA: 07-Aug-2013  PCP: Pcp Not In System PCP/Office notified:   Admit date: 08/07/2013 Date of Death: 08-08-13  Final Diagnoses:  Principal Problem: Acute Cardiopulmonary arrest   Acute on chronic systolic heart failure Active Problems:   Thyroid disease   Pulmonary hypertension   CAF- prior RFA, multiple DCCV-    Sick sinus syndrome   Long term (current) use of anticoagulants   Dyspnea on exertion   Orthopnea   Dyslipidemia   Weakness generalized   Cardiomyopathy- new LVD with an EF of 30-35% (? from AF)   Pacemaker- St Jude 2010   Acute renal insufficiency   History of present illness:  Gina [name] is a 79 y.o. female with a past medical history of chronic systolic congestive heart failure, with her last transthoracic echocardiogram performed on 07/24/2013 showed an ejection fraction of 30-35%, history of sick sinus syndrome, atrial fibrillation, on chronic anticoagulation with warfarin, who presents to the emergency department with complaints of worsening dyspnea on exertion. Patient reports having dyspnea over the last several weeks, progressively worsening, associated with dry cough, or increasing bilateral extremity pitting edema, weight gain, 2-3 pillow orthopnea, chest tightness, paroxysmal nocturnal dyspnea, generalized weakness and having an overall functional decline. She has not had recent falls, with her last fall being approximately 6 months ago. Patient reporting chest tightness to be located in the retrosternal region, characterized as constant. She denies nausea, vomiting, fevers, chills, abdominal pain, dysuria, hematuria, unilateral weakness, sick contacts. In the emergency department she had an elevated BNP of 7957. Chest x-ray showing cardiomegaly with increased opacification of a retro cardiac left lower lobe. Initial set of cardiac enzymes were negative.   Hospital Course:  This is an 79 year old  female with past medical history of CHF with an EF of 3035%, history of sick sinus syndrome status post pacemaker, atrial fibrillation on chronic warfarin treatment.  She presented to the emergency room with increasing shortness of breath especially on exertion. Patient was admitted and placed on IV Lasix. Cardiology did see the patient.  Patient unfortunately did not diuresis well with the Lasix. However her shortness of breath did improve during her stay. She was given supplemental oxygen via nasal cannula. Oxygen saturations and maintain during her stay. At approximately 1845 patient started having severe shortness of breath which was sudden onset. She did also state that she had midsternal pain by pointing to her chest and asked for help. Patient was sitting up in a chair at this time. Nursing was attempting to get patient back into the bed. Patient continued to have shortness of breath. Patient was placed on nonrebreather. Respiratory was called. Patient became very diaphoretic and was laid flat and developed agonal breathing. At this time CPR was started. Code was called. The residents did respond to the code please see the note. I attending also responded to the code. After approximately 3 rounds of CPR it was decided that further treatment would be futile and code ended at approximately 1910.   Time of death 1910  Signed:  Edsel Petrin  [facility] August 08, 2013, 7:42 PM

## 2013-07-30 NOTE — Progress Notes (Signed)
I came to see the patient this evening to follow-up on Mr. Gina [name] -- unfortunately the patient had just suffered Cardiac Arrest - after 20 min of CPR etc, with massive bleeding from ETT, the patient was pronounced dead by the primary / code team.  Marykay Lex, MD

## 2013-07-30 NOTE — Code Documentation (Cosign Needed)
CODE BLUE NOTE  Patient Name: Gina [name]   MRN: [id number]   Date of Birth/ Sex: [birth year] , female      Admission Date: August 18, 2013  Attending Provider: Edsel Petrin, DO  Primary Diagnosis: Atrial fibrillation [427.31] Orthopnea [786.02] Long term (current) use of anticoagulants [V58.61] Acute on chronic systolic heart failure [428.23] Dyspnea on exertion [786.09] Dyslipidemia [272.4]    Indication: Pt was in her usual state of health until this PM, when she was noted to have chest pain and  became unresponsive. Code blue was subsequently called. At the time of arrival on scene, ACLS protocol was underway.   Technical Description:  - CPR performance duration:  20  minutes  - Was defibrillation or cardioversion used? No   - Was external pacer placed? No  - Was patient intubated pre/post CPR? Yes    Medications Administered: Y = Yes; Blank = No Amiodarone    Atropine    Calcium   Y  Epinephrine   Y  Lidocaine    Magnesium    Norepinephrine    Phenylephrine    Sodium bicarbonate   Y  Vasopressin      Post CPR evaluation:  - Final Status - Was patient successfully resuscitated ? No   Miscellaneous Information:  - Time of death:  7:15  PM  - Primary team notified?  Yes  - Family Notified? Yes        Wenda Low, MD   07/27/2013, 7:27 PM

## 2013-07-30 NOTE — Progress Notes (Signed)
Amended Note:  Contacted by Quality Department to see if patient passed away while in restraints. Confirmed with RN who cared for the patient on the day of departure that patient was not in restraints.

## 2013-07-30 NOTE — Progress Notes (Signed)
ANTICOAGULATION CONSULT NOTE - Initial Consult  Pharmacy Consult for Warfarin Indication: atrial fibrillation  No Known Allergies  Patient Measurements: Height: 5\' 1"  (154.9 cm) Weight: 138 lb 0.1 oz (62.6 kg) (scale A) IBW/kg (Calculated) : 47.8  Vital Signs: Temp: 97.4 F (36.3 C) (09/29 0446) Temp src: Oral (09/29 0446) BP: 118/95 mmHg (09/29 0446) Pulse Rate: 76 (09/29 0446)  Labs:  Recent Labs  2013-08-07 1215  07-Aug-2013 1221 2013-08-07 1715 08/07/13 2216 07/27/2013 0350  HGB 14.3  --   --   --   --   --   HCT 41.7  --   --   --   --   --   PLT 234  --   --   --   --   --   LABPROT 27.9*  --   --   --   --  24.0*  INR 2.72*  --   --   --   --  2.23*  CREATININE  --   --  1.47*  --   --  1.45*  TROPONINI  --   < > <0.30 <0.30 <0.30 <0.30  < > = values in this interval not displayed.  Estimated Creatinine Clearance: 23.6 ml/min (by C-G formula based on Cr of 1.45).   Medical History: Past Medical History  Diagnosis Date  . Thyroid disease   . Hypercholesteremia   . Gout   . Hearing impaired   . Pulmonary hypertension   . Chronic lung disease   . Atrial fibrillation     RFA of flutter 2009  . Sick sinus syndrome 10/10    PTVDP- St Jude  . GERD (gastroesophageal reflux disease)   . Venous insufficiency   . Vitiligo   . Hypertrophic cardiomyopathy 02-13-2012    EF55% on 2-d-echo 02-13-2012  . Chronic anticoagulation   . Pacemaker 10/10    St Jude  . History of cardioversion 07-05-2007  . Coronary artery disease 1998    CABG X 2 '98, patent grafts '06 and 10/10    Assessment: 79 y.o. F who presented to the [facility] on 9/28 with worsening leg swelling and SOB and found to be having an AoCHF exacerbation. Pt. Is resumed on warfarin for hx Afib. INR 2.23 today, No bleeding noted per chart.   PTA dose: 2.5mg  daily  Goal of Therapy:  INR 2-3   Plan:  1. Warfarin 2.5 mg x 1 dose at 1800 today 2. Daily PT/INR 3. Will continue to monitor for any signs/symptoms of  bleeding and will follow up with PT/INR in the a.m.   Bayard Hugger, PharmD, BCPS  Clinical Pharmacist  Pager: [phone]  07/05/2013 10:15 AM

## 2013-07-30 NOTE — Progress Notes (Signed)
Nutrition Education Note  RD consulted for nutrition education regarding CHF.  RD provided "Low Sodium Nutrition Therapy" handout from the Academy of [employer]. Provided examples on ways to decrease sodium intake in pt's diet. Discouraged intake of processed foods and use of salt shaker. Encouraged fresh fruits and vegetables as well as whole grain sources of carbohydrates to maximize fiber intake.   RD discussed why it is important for patient to adhere to diet recommendations, and emphasized the role of fluids, foods to avoid, and importance of weighing self daily. Provided pt with sodium-free ways to flavor foods. Teach back method used.  Expect good compliance.  Body mass index is 26.09 kg/(m^2). Pt meets criteria for Overweight based on current BMI.  Current diet order is Heart Healthy, patient is consuming approximately 100% of meals at this time. Labs and medications reviewed. No further nutrition interventions warranted at this time. RD contact information provided. Encouraged pt to share handout with her daughter and to call with any questions or needs. If additional nutrition issues arise, please re-consult RD.   Ian Malkin RD, LDN Inpatient Clinical Dietitian Pager: [phone] After Hours Pager: [phone]

## 2013-07-30 NOTE — Telephone Encounter (Signed)
Pt. Deceased as 07/22/2013

## 2013-07-30 NOTE — Care Management Note (Signed)
    Page 1 of 1   2013-08-26     4:03:12 PM   CARE MANAGEMENT NOTE 08-26-13  Patient:  Gina [name],Gina [name]   Account Number:  [id number]  Date Initiated:  Aug 26, 2013  Documentation initiated by:  Oletta Cohn  Subjective/Objective Assessment:   79 y.o. female with a history of chronic systolic CHF, with her last  ejection fraction of 30-35%, history of sick sinus syndrome, AFIB; presents with SOB//home with spouse     Action/Plan:   diurese//home with home health   Anticipated DC Date:  07/31/2013   Anticipated DC Plan:  HOME W HOME HEALTH SERVICES      DC Planning Services  CM consult      [facility] Choice  HOME HEALTH   Choice offered to / List presented to:  C-4 Adult Children           Status of service:  In process, will continue to follow Medicare Important Message given?   (If response is "NO", the following Medicare IM given date fields will be blank) Date Medicare IM given:   Date Additional Medicare IM given:    Discharge Disposition:    Per UR Regulation:    If discussed at Long Length of Stay Meetings, dates discussed:    Comments:  26-Aug-2013 1515  Oletta Cohn, RN, BSN, [employer] [phone] Spoke with pt and adult daughters at bedside regarding discharge planning.  Daughters concerned that mom will need a Charity fundraiser and NA when pt returns home.  NCM provided list of HH agencies to choose from.  Daughters will look it over and make a decision tomorrow.  NCM will follow up.

## 2013-07-30 NOTE — Telephone Encounter (Signed)
Pt. Is deceased

## 2013-07-30 NOTE — Progress Notes (Signed)
Patient's BP is 86/56, PA currently on unit and made aware, orders given to hold Metoprolol; will continue to monitor patient. Lorretta Harp RN

## 2013-07-30 NOTE — Progress Notes (Signed)
Subjective:  Weak, she says she is still SOB  Objective:  Vital Signs in the last 24 hours: Temp:  [97.4 F (36.3 C)-98.4 F (36.9 C)] 97.5 F (36.4 C) (09/29 1019) Pulse Rate:  [71-85] 74 (09/29 1019) Resp:  [12-27] 20 (09/29 1019) BP: (86-143)/(56-95) 86/56 mmHg (09/29 1019) SpO2:  [78 %-100 %] 96 % (09/29 1019) Weight:  [138 lb 0.1 oz (62.6 kg)-139 lb 7 oz (63.248 kg)] 138 lb 0.1 oz (62.6 kg) (09/29 0436)  Intake/Output from previous day:  Intake/Output Summary (Last 24 hours) at 07/15/2013 1035 Last data filed at 07/13/2013 1021  Gross per 24 hour  Intake    846 ml  Output   1000 ml  Net   -154 ml    Physical Exam: General appearance: cooperative, no distress and slowed mentation Lungs: decreased breath sounds, poor effort Heart: irregularly irregular rhythm   Rate: 70  Rhythm: atrial fibrillation pacing on demand  Lab Results:  Recent Labs  06/30/2013 1215  WBC 4.7  HGB 14.3  PLT 234    Recent Labs  07/07/2013 1221 07/08/2013 0350  NA 134* 133*  K 4.8 4.4  CL 95* 94*  CO2 25 24  GLUCOSE 95 75  BUN 37* 39*  CREATININE 1.47* 1.45*    Recent Labs  07/29/2013 2216 07/13/2013 0350  TROPONINI <0.30 <0.30    Recent Labs  07/02/2013 0350  INR 2.23*    Imaging: Imaging results have been reviewed  Cardiac Studies:  Assessment/Plan:   Principal Problem:   Acute on chronic systolic heart failure Active Problems:   Cardiomyopathy- new LVD with an EF of 30-35% (? from AF)   CAF- prior RFA, multiple DCCV-    Dyspnea on exertion   Weakness generalized   Acute renal insufficiency   Thyroid disease   Pulmonary hypertension   Sick sinus syndrome   Long term (current) use of anticoagulants   Orthopnea   Dyslipidemia   Pacemaker- St Jude 2010    PLAN:  B/P low, hold Lopressor, consider low dose Dopamine. I briefly touched on Code status with her yesterday and she indicated she would need to discuss this with her husband. No significant diuresis since  admission.  Corine Shelter PA-C Beeper [phone] 07/24/2013, 10:35 AM

## 2013-07-30 NOTE — Progress Notes (Signed)
At 1843, the patient was sitting at the side of the bed and asked if she could use the bedside commode and sit in the chair, upon helping her to the bedside commode, I sat her in the chair and she began to complain of shortness of breath, vital signs were taken and patient's O2 sats were 70% and blood pressure was 70/56, charge nurse was called to the room and rapid response was called.  While the charge nurse and I began to help her to bed she began to complain of chest pain and as soon as the patient was laid in bed she became cyanotic and began agonal breaths, code was called and code team responded quickly. Death was pronounced at 1910.  D. Manson Passey RN

## 2013-07-30 NOTE — Progress Notes (Signed)
[facility]                                                                                Patient Demographics  Gina [name], is a 79 y.o. female, DOB - [birth year], ZOX:[id number]  Admit date - 07/16/2013   Admitting Physician Jeralyn Bennett, MD  Outpatient Primary MD for the patient is Pcp Not In System  LOS - 1   Chief Complaint  Patient presents with  . Leg Swelling  . Shortness of Breath        Assessment & Plan  1. Acute Respiratory failure secondary to acute on chronic systolic heart failure, EF 35-35%  Breathing improved as relayed by patient.   Will continue supplemental oxygen as well as continue lasix IV.  Patient has not had UO since admission.  Will consider dopamine once discussed with cardiology.  Cardiology following.     Continue strict I's and O's, daily weights, metoprolol (with holding parameters), ASA, and statin.   Currently not on ACEI.     2.  Thyroid disease- Continue synthroid    3.  Dyslipidemia - continue statin therapy, Lipitor  4.  Weakness generalized - PT consulted  5.  Cardiomyopathy- new LVD with an EF of 30-35% (? from AF)  Cardiology following   6.  Pacemaker- St Jude 2010   7.  Acute renal insufficiency - Will continue to monitor.  Although will be on lasix therapy.  8.  Possibly Urinary tract infection with pyuria- Continue Ceftriaxone  9.  Questionable Community Acquired Pneumonia/Infiltrate on CXR- Will continue to monitor.  Will order CXR for the morning to reassess.  Patient currently afebrile, no leukocytosis, or complaints of cough. Patient on Azithromycin and ceftriaxone    10. Afib- currently rate controlled, continue amiodarone, warfarin (currently therapeutic), and metoprolol (with holding parameters)  11. Hypotension  Place holding parameters on metoprolol   Code Status: Full  Family Communication: None  Disposition Plan: Admitted.     Procedures  Echocardiogram 07/24/13 Impressions: Underlying AF  with V paced rhythm and pacder induced LBBB. Moderate intra-ventricular dyssynchrony with mod. Severe TR, MR & moderate pul. hypertension. EF approx. 35%  Consults  Cardiology  DVT Prophylaxis  Warfarin   Lab Results  Component Value Date   PLT 234 07/22/2013    Medications  Scheduled Meds: . amiodarone  100 mg Oral Daily  . antiseptic oral rinse  15 mL Mouth Rinse BID  . aspirin EC  81 mg Oral Daily  . atorvastatin  10 mg Oral q1800  . azithromycin  500 mg Intravenous Q24H  . cefTRIAXone (ROCEPHIN)  IV  1 g Intravenous Q24H  . furosemide  40 mg Intravenous BID  . ketorolac  1 drop Both Eyes QID  . latanoprost  1 drop Both Eyes QHS  . levothyroxine  100 mcg Oral QAC breakfast  . metoprolol tartrate  12.5 mg Oral BID  . ofloxacin  1 drop Both Eyes QID  . prednisoLONE acetate  1 drop Both Eyes BID  . sodium chloride  3 mL Intravenous Q12H  . warfarin  2.5 mg Oral ONCE-1800  . Warfarin - Pharmacist Dosing Inpatient   Does not apply [phone]  Continuous Infusions:  PRN Meds:.sodium chloride, acetaminophen, ondansetron (ZOFRAN) IV, polyethylene glycol, sodium chloride  Antibiotics    Anti-infectives   Start     Dose/Rate Route Frequency Ordered Stop   2013/08/15 1430  cefTRIAXone (ROCEPHIN) 1 g in dextrose 5 % 50 mL IVPB     1 g 100 mL/hr over 30 Minutes Intravenous Every 24 hours 06/30/2013 1623     08/15/2013 1430  azithromycin (ZITHROMAX) 500 mg in dextrose 5 % 250 mL IVPB     500 mg 250 mL/hr over 60 Minutes Intravenous Every 24 hours 07/20/2013 1623     07/10/2013 1415  azithromycin (ZITHROMAX) tablet 500 mg     500 mg Oral  Once 07/19/2013 1413 07/08/2013 1426   07/13/2013 1415  cefTRIAXone (ROCEPHIN) 1 g in dextrose 5 % 50 mL IVPB  Status:  Discontinued     1 g 100 mL/hr over 30 Minutes Intravenous Every 24 hours 07/13/2013 1413 06/30/2013 1615       Time Spent in minutes   30 minutes   Katrin Grabel D.O. on 08/15/13 at 1:11 PM  Between 7am to 7pm - Pager -  [phone]  After 7pm go to [email or web address] - password [email or web address]  And look for the night coverage person covering for me after hours  [facility] Office  [phone]    Subjective:   Gina [name] seen and examined today.  Patient states she is feeling better.  Although, she does not like to keep her oxygen on.  She still has shortness of breath but feels improved.    Patient denies dizziness, chest pain, abdominal pain, N/V/D/C, new weakness, numbess, tingling.    Objective:   Filed Vitals:   August 15, 2013 0436 August 15, 2013 0446 2013/08/15 1000 08-15-2013 1019  BP:  118/95 86/56 86/56   Pulse:  76 74 74  Temp:  97.4 F (36.3 C)  97.5 F (36.4 C)  TempSrc:  Oral  Oral  Resp:  20  20  Height:      Weight: 62.6 kg (138 lb 0.1 oz)     SpO2:  94%  96%    Wt Readings from Last 3 Encounters:  15-Aug-2013 62.6 kg (138 lb 0.1 oz)  03/02/12 66.4 kg (146 lb 6.2 oz)     Intake/Output Summary (Last 24 hours) at 15-Aug-2013 1311 Last data filed at 08/15/2013 1021  Gross per 24 hour  Intake    846 ml  Output   1000 ml  Net   -154 ml    Exam  General: Well developed, well nourished, NAD, appears stated age  HEENT: NCAT, PERRLA, EOMI, Anicteic Sclera, mucous membranes moist. No pharyngeal erythema or exudates  Neck: Supple, +JVP, no masses,  Cardiovascular: S1 S2 auscultated, no rubs, murmurs or gallops.  No ventricular heave. Regular rate and rhythm.  Respiratory: Scattered crackles, heard at lower lung bases.    Abdomen: Soft, non-tender to palpation, + bowel sounds  Extremities:  2+ pitting edema in LE B/L,  warm dry without cyanosis clubbing   Neuro: AAOx3, cranial nerves grossly intact.  Skin: Without rashes exudates or nodules, venous stasis changes on lower ext B/L  Psych: Normal affect and demeanor with intact judgement and insight  Data Review   Micro Results No results found for this or any previous visit (from the past 240 hour(s)).  Radiology Reports Dg Chest  Port 1 View  07/14/2013   CLINICAL DATA:  Shortness of breath. Hypoxia. Peripheral edema.  EXAM: PORTABLE CHEST - 1 VIEW  COMPARISON:  03/03/2012  FINDINGS: Increased opacity is seen in the left retrocardiac lung base since previous study. Improved aeration of both lungs is seen. Right lung remains clear. Cardiomegaly is stable. No evidence of congestive heart failure. Dual lead transvenous pacemaker remains in appropriate position.  IMPRESSION: Increased opacification of retrocardiac left lower lobe.  Stable cardiomegaly.   Electronically Signed   By: Myles Rosenthal   On: August 06, 2013 12:35    CBC  Recent Labs Lab 08-06-13 1215  WBC 4.7  HGB 14.3  HCT 41.7  PLT 234  MCV 82.1  MCH 28.1  MCHC 34.3  RDW 17.7*    Chemistries   Recent Labs Lab 2013-08-06 1221 08/06/13 1715 07/05/2013 0350  NA 134*  --  133*  K 4.8  --  4.4  CL 95*  --  94*  CO2 25  --  24  GLUCOSE 95  --  75  BUN 37*  --  39*  CREATININE 1.47*  --  1.45*  CALCIUM 9.0  --  8.9  MG  --  2.6*  --   AST 32  --   --   ALT 26  --   --   ALKPHOS 125*  --   --   BILITOT 1.9*  --   --    ------------------------------------------------------------------------------------------------------------------ estimated creatinine clearance is 23.6 ml/min (by C-G formula based on Cr of 1.45). ------------------------------------------------------------------------------------------------------------------ No results found for this basename: HGBA1C,  in the last 72 hours ------------------------------------------------------------------------------------------------------------------ No results found for this basename: CHOL, HDL, LDLCALC, TRIG, CHOLHDL, LDLDIRECT,  in the last 72 hours ------------------------------------------------------------------------------------------------------------------  Recent Labs  August 06, 2013 1715  TSH 11.755*    ------------------------------------------------------------------------------------------------------------------ No results found for this basename: VITAMINB12, FOLATE, FERRITIN, TIBC, IRON, RETICCTPCT,  in the last 72 hours  Coagulation profile  Recent Labs Lab 07/24/13 1504 08/06/13 1215 07/04/2013 0350  INR 2.1 2.72* 2.23*    No results found for this basename: DDIMER,  in the last 72 hours  Cardiac Enzymes  Recent Labs Lab 08/06/2013 1715 August 06, 2013 2216 07/02/2013 0350  TROPONINI <0.30 <0.30 <0.30   ------------------------------------------------------------------------------------------------------------------ No components found with this basename: POCBNP,

## 2013-07-30 NOTE — Progress Notes (Signed)
Chaplain responded to Code [facility] page for pt. CPR was done extensively but heartbeat could not be regained. No family present during code. Per nurse, pt's two daughters were here earlier but went to supper. Another nurse had called them and asked them to return. When pt's daughters returned, doctor and I met them in the hall and led them to consult room where doctor shared that their mother had passed. She (doctor) was very compassionate.Pt's daughters were tearful briefly but understood that medical team had done everything they could do. After nurses had cleaned pt I led daughters to pt's room to say their goodbyes. Nurses there embraced them and were themselves distressed that pt had died. As daughters departed I gave them the phone number for Patient Placement. They expressed appreciation for chaplain support.

## 2013-07-30 NOTE — Evaluation (Signed)
Physical Therapy Evaluation Patient Details Name: Gina [name] MRN: [id number] DOB: Oct 25, 1925 Today's Date: 07/20/2013 Time: 1478-2956 PT Time Calculation (min): 23 min  PT Assessment / Plan / Recommendation History of Present Illness  Gina [name] is a 79 y.o. female with a past medical history of chronic systolic congestive heart failure, with her last transthoracic echocardiogram performed on 07/24/2013 showed an ejection fraction of 30-35%, history of sick sinus syndrome, atrial fibrillation, on chronic anticoagulation with warfarin, who presents to the emergency department with complaints of worsening dyspnea on exertion. Patient reports having dyspnea over the last several weeks, progressively worsening, associated with dry cough, or increasing bilateral extremity pitting edema, weight gain, 2-3 pillow orthopnea, chest tightness, paroxysmal nocturnal dyspnea, generalized weakness and having an overall functional decline. She has not had recent falls, with her last fall being approximately 6 months ago. Patient reporting chest tightness to be located in the retrosternal region, characterized as constant.  Clinical Impression  Pt with noted generalized deconditioning and + SOB with all activity. Pt reports she has 24/7 assist via husband. Pt to benefit from HHPT to maximize functional recovery and improve activity tolerance as well as education on energy conservation.    PT Assessment  Patient needs continued PT services    Follow Up Recommendations  Home health PT;Supervision/Assistance - 24 hour    Does the patient have the potential to tolerate intense rehabilitation      Barriers to Discharge        Equipment Recommendations  None recommended by PT    Recommendations for Other Services     Frequency Min 3X/week    Precautions / Restrictions Precautions Precautions: Fall Precaution Comments: Pt currently on 5LO2 via [place] but was no O2 dependent at home. bilat LE edema  L >R Restrictions Weight Bearing Restrictions: No   Pertinent Vitals/Pain Denies pain      Mobility  Bed Mobility Bed Mobility: Not assessed (pt received sitting EOB) Transfers Transfers: Sit to Stand;Stand to Sit Sit to Stand: 4: Min assist;With upper extremity assist;From bed Stand to Sit: 4: Min assist;With upper extremity assist;To chair/3-in-1 Details for Transfer Assistance: increased time, definite use of hands, uncontrolled descent into chair Ambulation/Gait Ambulation/Gait Assistance: 4: Min assist Ambulation Distance (Feet): 15 Feet Assistive device: Rolling walker Ambulation/Gait Assistance Details: minA for walker management due to RW not being a rollator. + SOB, SpO2 94% on 5 LO2 Gait Pattern: Step-to pattern;Decreased stride length Gait velocity: slow Stairs: No    Exercises     PT Diagnosis: Difficulty walking;Generalized weakness  PT Problem List: Decreased strength;Decreased activity tolerance;Decreased balance;Decreased mobility PT Treatment Interventions: DME instruction;Gait training;Stair training;Functional mobility training;Therapeutic activities;Therapeutic exercise     PT Goals(Current goals can be found in the care plan section) Acute Rehab PT Goals Patient Stated Goal: home PT Goal Formulation: With patient Time For Goal Achievement: 08/04/13 Potential to Achieve Goals: Good  Visit Information  Last PT Received On: 07/24/2013 Assistance Needed: +1 History of Present Illness: Gina [name] is a 79 y.o. female with a past medical history of chronic systolic congestive heart failure, with her last transthoracic echocardiogram performed on 07/24/2013 showed an ejection fraction of 30-35%, history of sick sinus syndrome, atrial fibrillation, on chronic anticoagulation with warfarin, who presents to the emergency department with complaints of worsening dyspnea on exertion. Patient reports having dyspnea over the last several weeks, progressively  worsening, associated with dry cough, or increasing bilateral extremity pitting edema, weight gain, 2-3 pillow orthopnea, chest tightness, paroxysmal  nocturnal dyspnea, generalized weakness and having an overall functional decline. She has not had recent falls, with her last fall being approximately 6 months ago. Patient reporting chest tightness to be located in the retrosternal region, characterized as constant.       Prior Functioning  Home Living Family/patient expects to be discharged to:: Private residence Living Arrangements: Spouse/significant other Available Help at Discharge: Family;Available 24 hours/day Type of Home: House Home Access: Stairs to enter [employer] of Steps: 5 Entrance Stairs-Rails: Right Home Layout: One level Home Equipment: Walker - 4 wheels;Shower seat Prior Function Level of Independence: Independent with assistive device(s) Comments: pt uses rollator 100% of time and reports being indep with ADLs Communication Communication: HOH Dominant Hand: Right    Cognition  Cognition Arousal/Alertness: Awake/alert Behavior During Therapy: WFL for tasks assessed/performed Overall Cognitive Status: Within Functional Limits for tasks assessed    Extremity/Trunk Assessment Upper Extremity Assessment Upper Extremity Assessment: RUE deficits/detail;LUE deficits/detail RUE Deficits / Details: 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

## 2013-07-30 NOTE — Progress Notes (Signed)
Report received, patient is resting, assessment has been completed; will continue to monitor patient. Lorretta Harp RN

## 2013-07-30 DEATH — deceased

## 2013-08-01 MED FILL — Medication: Qty: 1 | Status: AC

## 2013-08-30 DEATH — deceased

## 2013-10-05 IMAGING — CT CT L SPINE W/O CM
4 of 10 series · 11 of 33 positions shown, 13 images · non-contrast
Comparison: MRI 09/04/2008.

CLINICAL DATA: Low back pain with left leg pain for months.  Fall
recently landing on the coccyx.

CT LUMBAR SPINE WITHOUT CONTRAST
TECHNIQUE: Multidetector CT imaging of the lumbar spine was
performed without intravenous contrast administration. Multiplanar
CT image reconstructions were also generated.

[Series 3: l spine bone · axial · 0.27mm/px · z∈[-111,-46]mm · 2 of 79 slices shown, 3 images]
[im 27/79  soft-tissue]
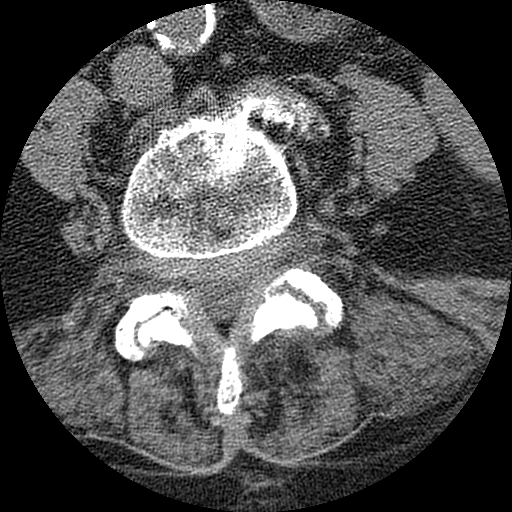
[im 27/79  bone]
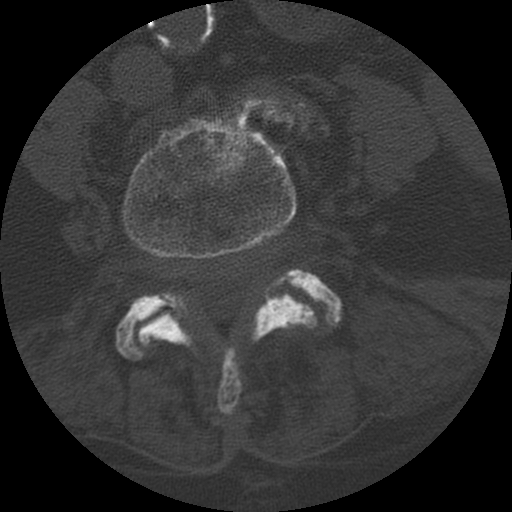
[im 53/79  bone]
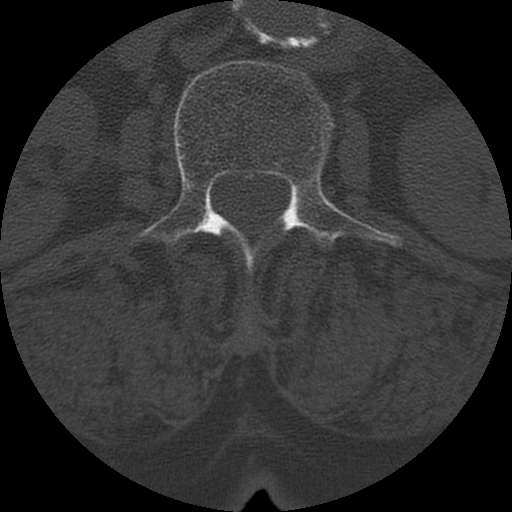

[Series 4: l spine soft · axial · 0.27mm/px · z∈[-128,-31]mm · 3 of 79 slices shown]
[im 20/79  soft-tissue]
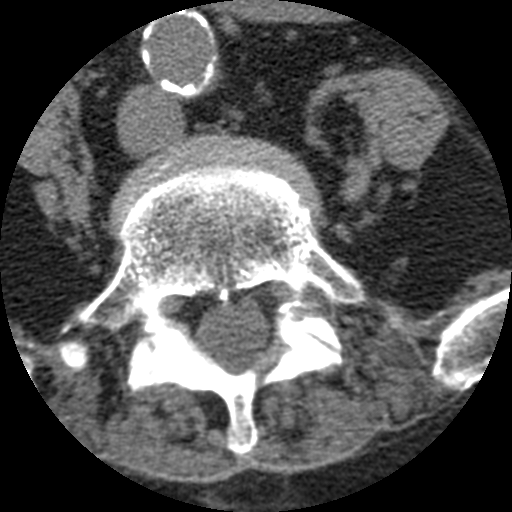
[im 40/79  soft-tissue]
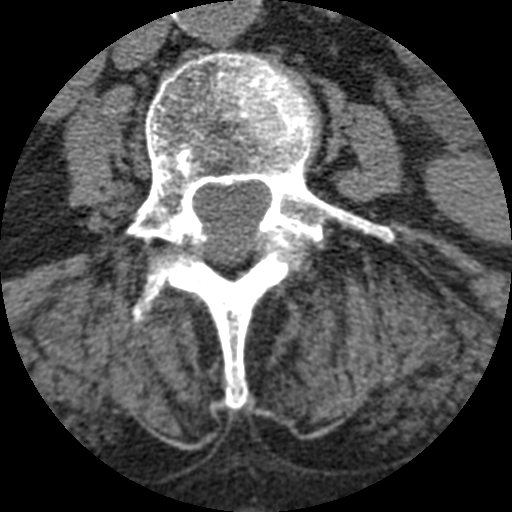
[im 59/79  soft-tissue]
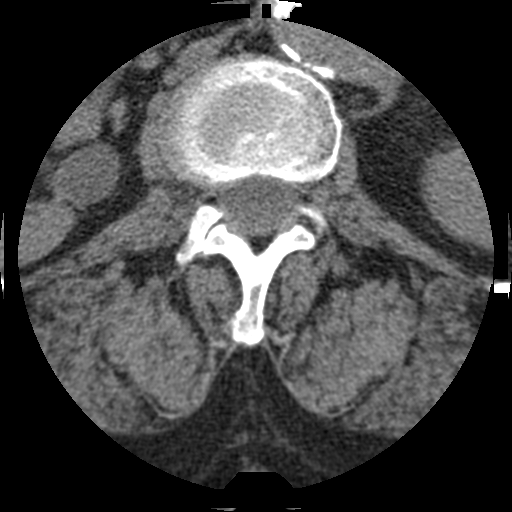

[Series 201: cor lower · coronal · 0.39mm/px · 1 of 51 slices shown]
[im 26/51  bone]
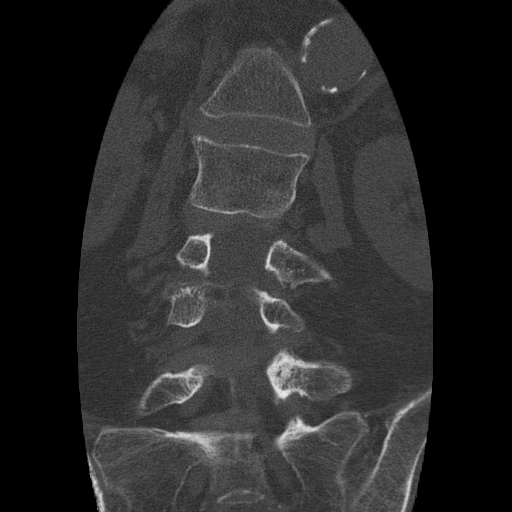

[Series 202: sag · sagittal · 0.39mm/px · 5 of 51 slices shown, 6 images]
[im 17/51  bone]
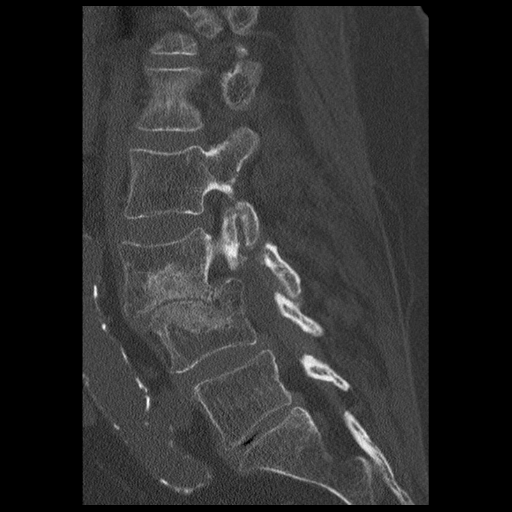
[im 21/51  bone]
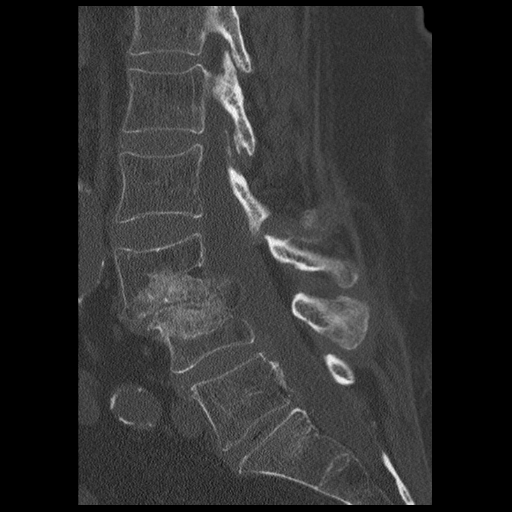
[im 26/51  soft-tissue]
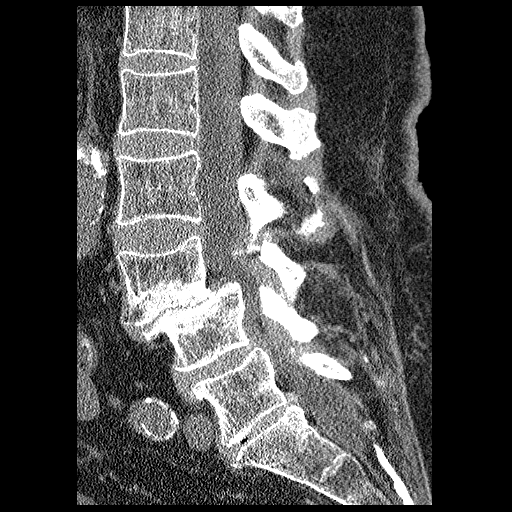
[im 26/51  bone]
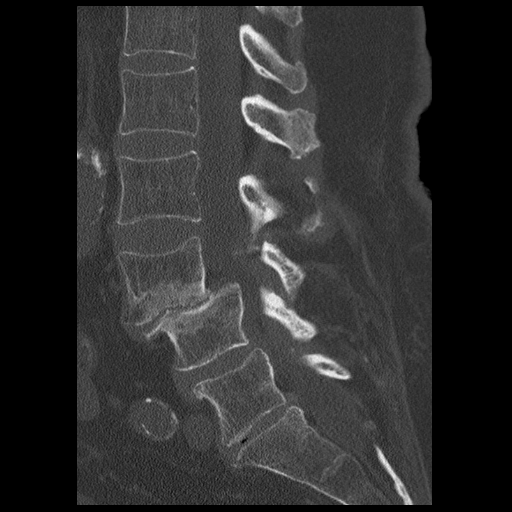
[im 30/51  bone]
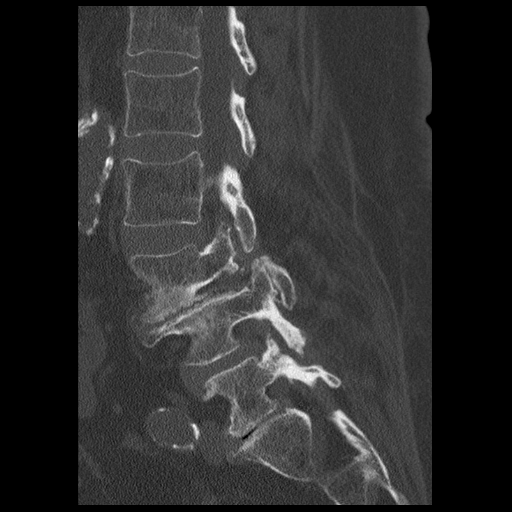
[im 34/51  bone]
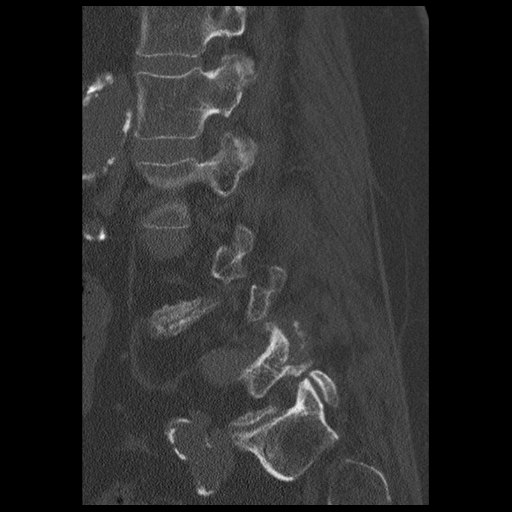

[11 of 33 positions shown; findings below may reference images not displayed]

FINDINGS: Lumbar spinal alignment is little changed.  Dextroconvex
curvature with the apex at L3-L4.  Grade 2 anterolisthesis appears
unchanged compared to prior, measuring 10 mm.  Severe degenerative
endplate changes at L3-L4.  Vertebral body height is preserved
without compression fracture.  Bilateral L3-L4 pars defects are
present accounting for the anterolisthesis.  Markedly tortuous
atherosclerotic abdominal aorta. Tortuous common iliac arteries
with ectasia of the right common iliac, measuring 18 mm.  No acute
features.

T12-L1:  Negative.

L1-L2:  Mild disc bulging.  No stenosis.

L2-L3:  Shallow circumferential disc bulge.  Bulging disc extends
into both neural foramina with mild right foraminal stenosis.
Facet degeneration is present.

L3-L4:  Bilateral L3 pars defects and anterolisthesis.  Severe left
and moderate right foraminal stenosis associated with
spondylolisthesis, uncoverage of the disc.  Central stenosis is
mild.

L4-L5:  Disc degeneration.  Grade 1 anterolisthesis of L4 on L5
measuring 2 mm appears degenerative, associated with facet
arthrosis.  Circumferential disc bulge is present with broad-based
posterior bulge.  Mild central stenosis.  Posterior ligamentum
flavum redundancy.  Facet arthrosis.  Moderate bilateral foraminal
stenosis is present.

L5-S1:  Disc desiccation with degenerative endplate changes and
vacuum disc.  Mild symmetric bilateral foraminal stenosis
associated with degenerated disc. Central canal and lateral
recesses patent.

Visualized sacrum appears intact.  Bilateral SI joint degenerative
disease.
IMPRESSION: 1.  Unchanged grade 2 anterolisthesis of L3 on L4 secondary to
bilateral L3 pars defects.  Severe left and moderate right
foraminal stenosis is multifactorial.

2.  L4-L5 moderate bilateral foraminal stenosis associated with
grade 1 anterolisthesis, facet arthrosis and broad-based posterior
disc bulging.
3.  L5-S1 severe disc degeneration with broad-based posterior disc
bulge and mild symmetric bilateral foraminal stenosis.

## 2014-02-03 IMAGING — CR DG CHEST 1V PORT
1 series · 1 of 1 positions shown · non-contrast
Comparison: Chest radiograph performed 01/13/2011

CLINICAL DATA: Right-sided chest pain and shortness of breath.

PORTABLE CHEST - 1 VIEW

[AP]
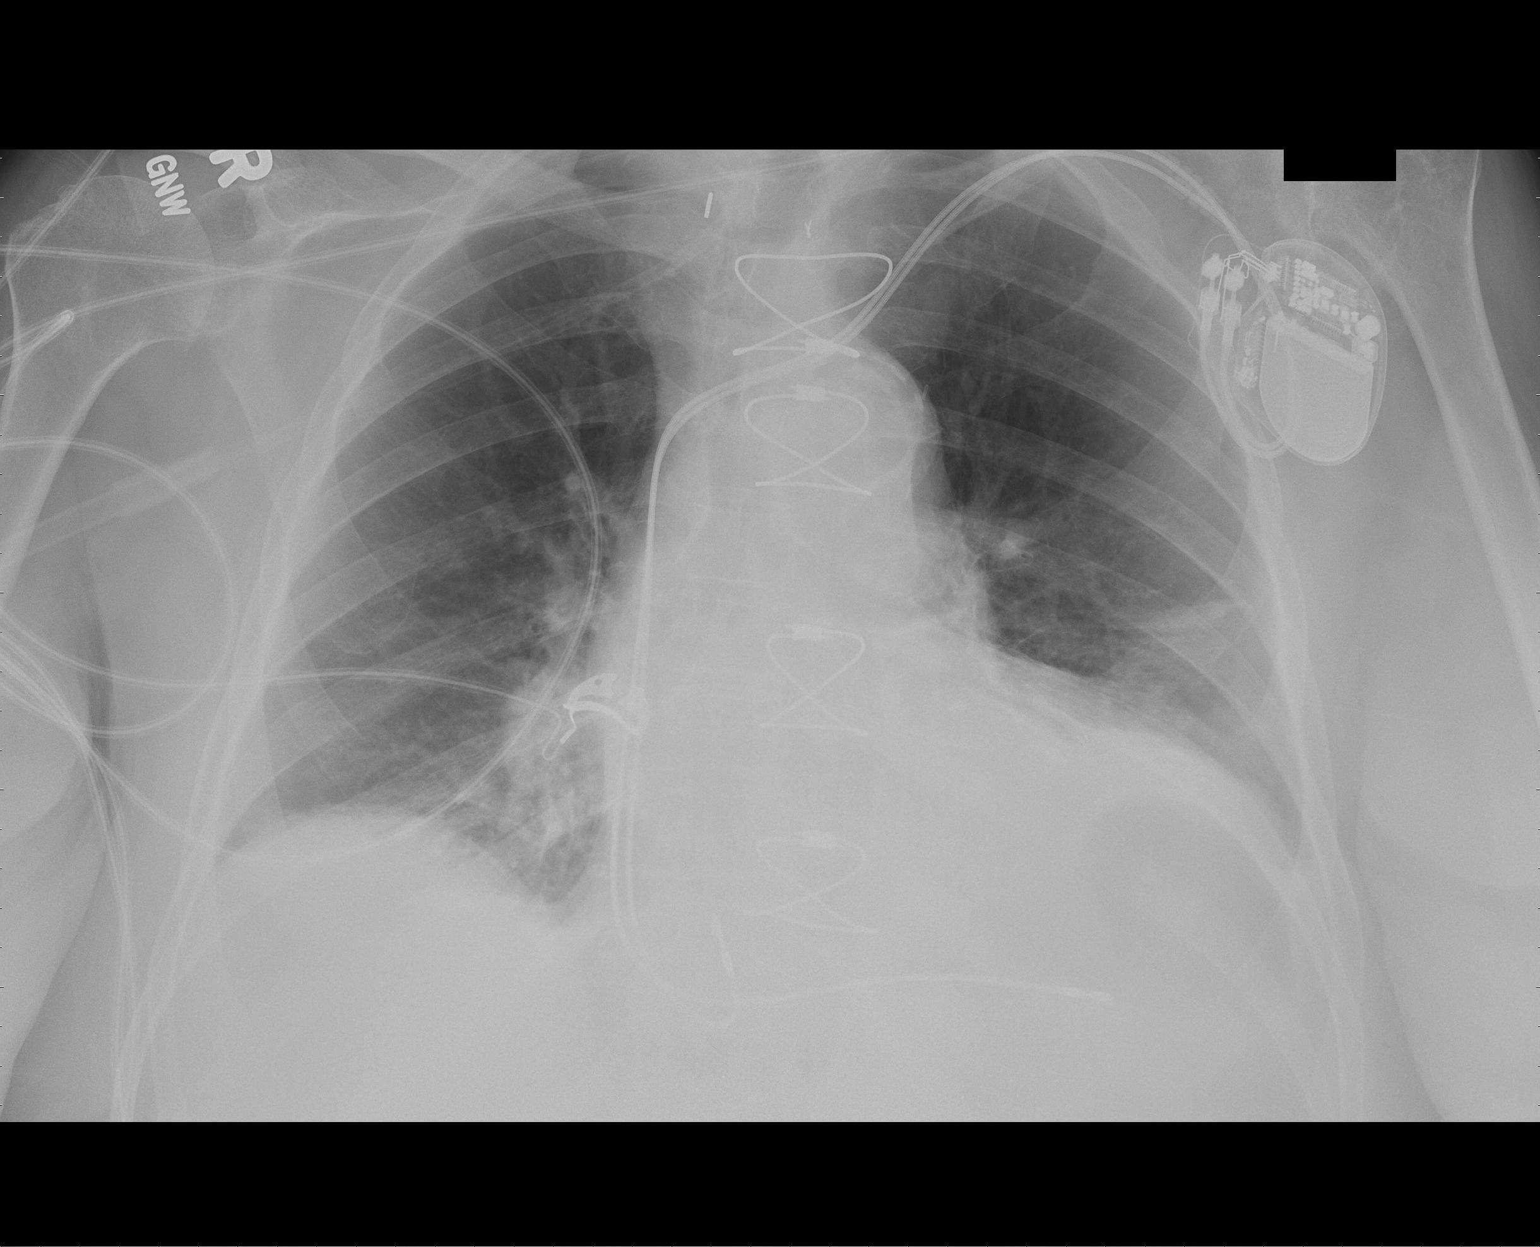

[1 of 1 positions shown; findings below may reference images not displayed]

FINDINGS: There is persistent elevation of the left hemidiaphragm.
Mild bibasilar opacities may reflect atelectasis or pneumonia.  No
definite pleural effusion or pneumothorax is identified.

The cardiomediastinal silhouette is borderline enlarged; the
patient is status post median sternotomy.  A pacemaker is seen
overlying the left chest wall, with leads ending overlying the
right atrium and right ventricle.  No acute osseous abnormalities
are seen.
IMPRESSION: Mild bibasilar airspace opacities may reflect atelectasis or
pneumonia; borderline cardiomegaly noted.

## 2015-06-30 IMAGING — CR DG CHEST 1V PORT
1 series · 1 of 1 positions shown · non-contrast
Comparison: 03/03/2012

CLINICAL DATA: Shortness of breath. Hypoxia. Peripheral edema.

EXAM:
PORTABLE CHEST - 1 VIEW

[AP]
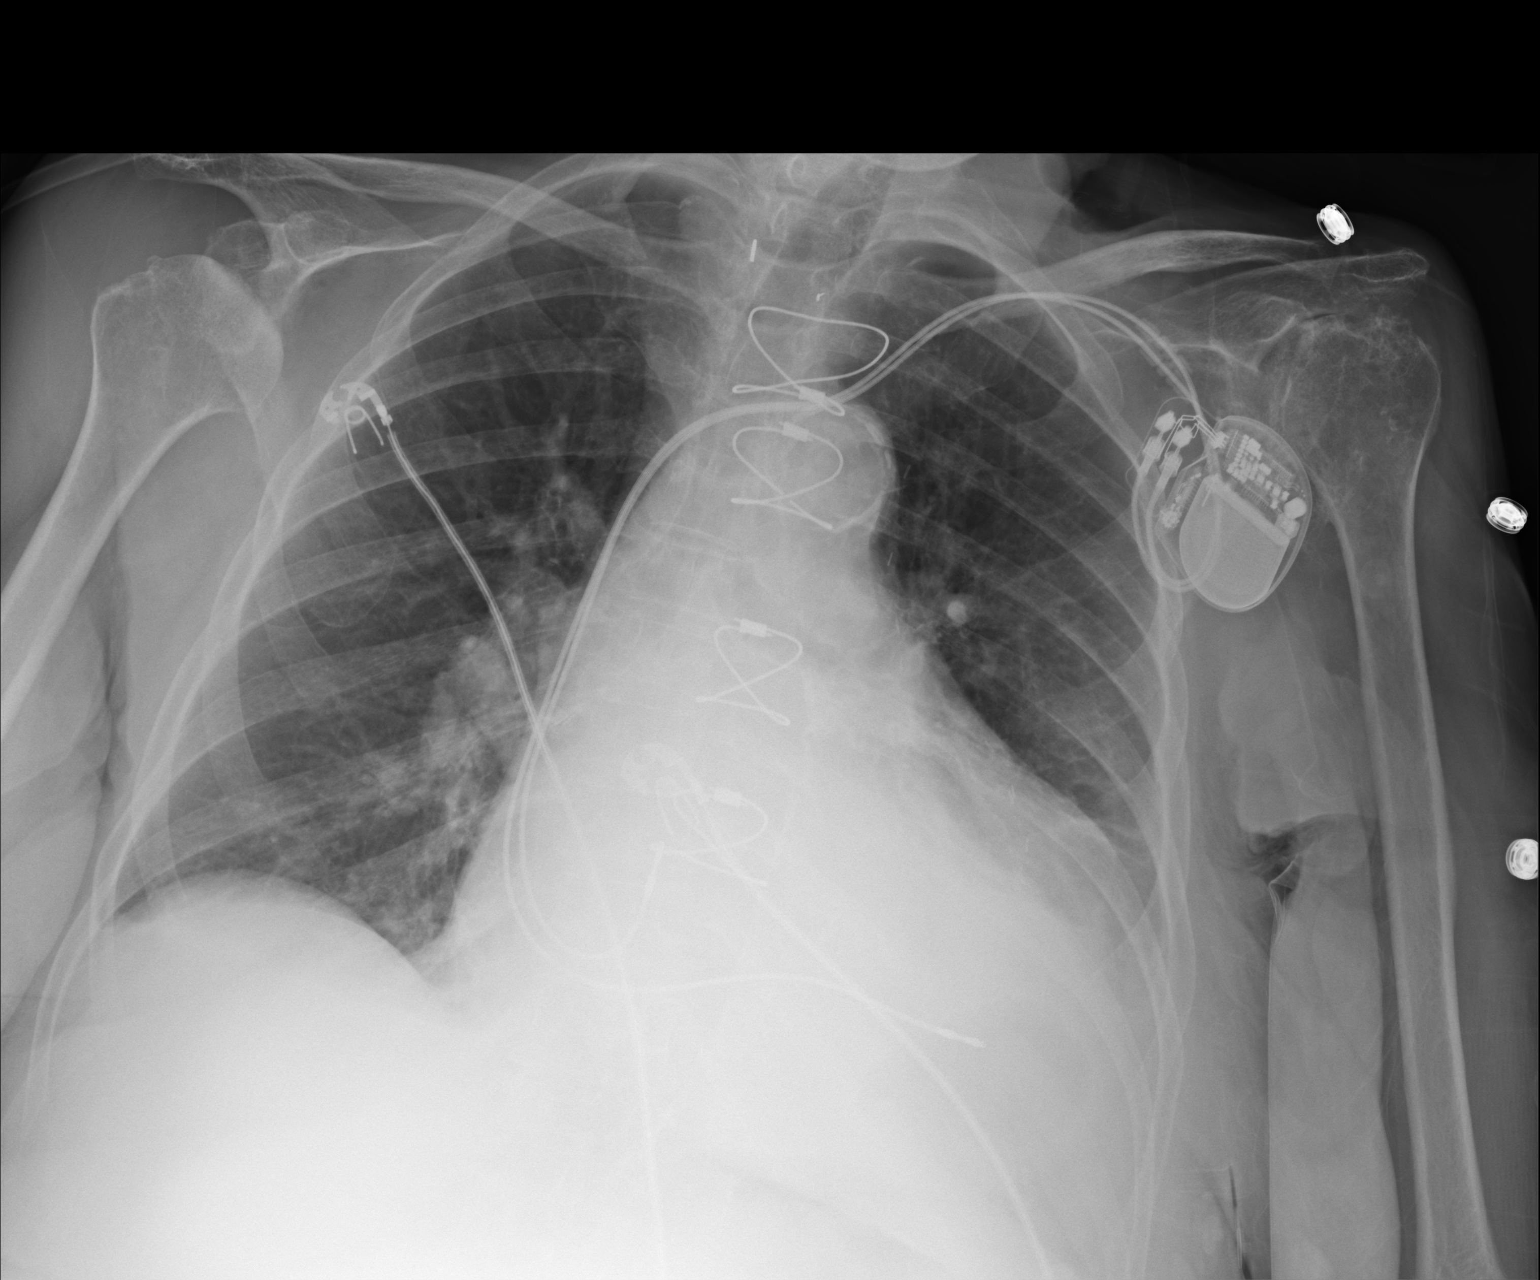

[1 of 1 positions shown; findings below may reference images not displayed]

FINDINGS: Increased opacity is seen in the left retrocardiac lung base since
previous study. Improved aeration of both lungs is seen. Right lung
remains clear. Cardiomegaly is stable. No evidence of congestive
heart failure. Dual lead transvenous pacemaker remains in
appropriate position.
IMPRESSION: Increased opacification of retrocardiac left lower lobe.

Stable cardiomegaly.
